# Patient Record
Sex: Male | Born: 1950
Health system: Southern US, Community
[De-identification: ages and names within clinical notes are randomized; demographics above are authoritative.]

## PROBLEM LIST (undated history)

## (undated) DIAGNOSIS — G629 Polyneuropathy, unspecified: Secondary | ICD-10-CM

## (undated) DIAGNOSIS — R9439 Abnormal result of other cardiovascular function study: Secondary | ICD-10-CM

## (undated) DIAGNOSIS — D649 Anemia, unspecified: Secondary | ICD-10-CM

## (undated) DIAGNOSIS — R0602 Shortness of breath: Secondary | ICD-10-CM

## (undated) DIAGNOSIS — D509 Iron deficiency anemia, unspecified: Secondary | ICD-10-CM

## (undated) DIAGNOSIS — I219 Acute myocardial infarction, unspecified: Secondary | ICD-10-CM

## (undated) DIAGNOSIS — E669 Obesity, unspecified: Secondary | ICD-10-CM

## (undated) DIAGNOSIS — K625 Hemorrhage of anus and rectum: Secondary | ICD-10-CM

## (undated) DIAGNOSIS — I2510 Atherosclerotic heart disease of native coronary artery without angina pectoris: Secondary | ICD-10-CM

## (undated) DIAGNOSIS — I10 Essential (primary) hypertension: Secondary | ICD-10-CM

## (undated) DIAGNOSIS — E785 Hyperlipidemia, unspecified: Secondary | ICD-10-CM

## (undated) DIAGNOSIS — I249 Acute ischemic heart disease, unspecified: Secondary | ICD-10-CM

## (undated) HISTORY — DX: Polyneuropathy, unspecified: G62.9

## (undated) HISTORY — PX: OTHER SURGICAL HISTORY: SHX169

---

## 1979-02-01 HISTORY — PX: EXCISIONAL HEMORRHOIDECTOMY: SHX1541

## 1998-02-11 ENCOUNTER — Emergency Department (HOSPITAL_COMMUNITY): Admission: EM | Admit: 1998-02-11 | Discharge: 1998-02-12 | Payer: Self-pay | Admitting: Emergency Medicine

## 2005-06-02 HISTORY — PX: TESTICLE REMOVAL: SHX68

## 2006-01-04 ENCOUNTER — Emergency Department (HOSPITAL_COMMUNITY): Admission: EM | Admit: 2006-01-04 | Discharge: 2006-01-04 | Payer: Self-pay | Admitting: Emergency Medicine

## 2006-01-14 ENCOUNTER — Ambulatory Visit (HOSPITAL_COMMUNITY): Admission: RE | Admit: 2006-01-14 | Discharge: 2006-01-14 | Payer: Self-pay | Admitting: Urology

## 2006-01-21 ENCOUNTER — Encounter (INDEPENDENT_AMBULATORY_CARE_PROVIDER_SITE_OTHER): Payer: Self-pay | Admitting: Specialist

## 2006-01-21 ENCOUNTER — Ambulatory Visit (HOSPITAL_BASED_OUTPATIENT_CLINIC_OR_DEPARTMENT_OTHER): Admission: RE | Admit: 2006-01-21 | Discharge: 2006-01-21 | Payer: Self-pay | Admitting: Urology

## 2009-06-02 DIAGNOSIS — I249 Acute ischemic heart disease, unspecified: Secondary | ICD-10-CM

## 2009-06-02 HISTORY — DX: Acute ischemic heart disease, unspecified: I24.9

## 2009-07-08 ENCOUNTER — Inpatient Hospital Stay (HOSPITAL_COMMUNITY): Admission: RE | Admit: 2009-07-08 | Discharge: 2009-07-12 | Payer: Self-pay | Admitting: Emergency Medicine

## 2009-10-26 ENCOUNTER — Inpatient Hospital Stay (HOSPITAL_COMMUNITY): Admission: AD | Admit: 2009-10-26 | Discharge: 2009-10-27 | Payer: Self-pay | Admitting: Cardiovascular Disease

## 2010-08-19 LAB — URINALYSIS, ROUTINE W REFLEX MICROSCOPIC
Bilirubin Urine: NEGATIVE
Hgb urine dipstick: NEGATIVE
Nitrite: NEGATIVE
Protein, ur: NEGATIVE mg/dL

## 2010-08-19 LAB — CARDIAC PANEL(CRET KIN+CKTOT+MB+TROPI)
Total CK: 102 U/L (ref 7–232)
Total CK: 119 U/L (ref 7–232)
Troponin I: 0.16 ng/mL — ABNORMAL HIGH (ref 0.00–0.06)
Troponin I: 0.23 ng/mL — ABNORMAL HIGH (ref 0.00–0.06)

## 2010-08-19 LAB — LIPID PANEL
HDL: 55 mg/dL (ref >39)
Triglycerides: 120 mg/dL (ref <150)
VLDL: 24 mg/dL (ref 0–40)

## 2010-08-19 LAB — BASIC METABOLIC PANEL
BUN: 10 mg/dL (ref 6–23)
CO2: 29 mEq/L (ref 19–32)
Calcium: 8.8 mg/dL (ref 8.4–10.5)
Calcium: 9.1 mg/dL (ref 8.4–10.5)
GFR calc Af Amer: >60 mL/min (ref >60)
GFR calc non Af Amer: >60 mL/min (ref >60)
Glucose, Bld: 141 mg/dL — ABNORMAL HIGH (ref 70–99)
Glucose, Bld: 96 mg/dL (ref 70–99)
Potassium: 3.8 mEq/L (ref 3.5–5.1)

## 2010-08-19 LAB — CBC
HCT: 37.7 % — ABNORMAL LOW (ref 39.0–52.0)
Hemoglobin: 13.3 g/dL (ref 13.0–17.0)
MCHC: 35.3 g/dL (ref 30.0–36.0)
MCHC: 35.4 g/dL (ref 30.0–36.0)
MCV: 88 fL (ref 78.0–100.0)
Platelets: 245 10*3/uL (ref 150–400)
RBC: 4.1 MIL/uL — ABNORMAL LOW (ref 4.22–5.81)
RBC: 4.26 MIL/uL (ref 4.22–5.81)
RDW: 13.5 % (ref 11.5–15.5)
RDW: 13.6 % (ref 11.5–15.5)

## 2010-08-19 LAB — PROTIME-INR
INR: 0.85 (ref 0.00–1.49)
Prothrombin Time: 11.5 seconds — ABNORMAL LOW (ref 11.6–15.2)

## 2010-08-19 LAB — URINE MICROSCOPIC-ADD ON

## 2010-08-22 LAB — DIFFERENTIAL
Basophils Absolute: 0 10*3/uL (ref 0.0–0.1)
Lymphocytes Relative: 9 % — ABNORMAL LOW (ref 12–46)
Monocytes Absolute: 0.7 10*3/uL (ref 0.1–1.0)
Monocytes Relative: 5 % (ref 3–12)
Neutro Abs: 11.8 10*3/uL — ABNORMAL HIGH (ref 1.7–7.7)

## 2010-08-22 LAB — URINALYSIS, ROUTINE W REFLEX MICROSCOPIC
Bilirubin Urine: NEGATIVE
Ketones, ur: NEGATIVE mg/dL
Protein, ur: NEGATIVE mg/dL
Urobilinogen, UA: 0.2 mg/dL (ref 0.0–1.0)

## 2010-08-22 LAB — COMPREHENSIVE METABOLIC PANEL
ALT: 17 U/L (ref 0–53)
AST: 19 U/L (ref 0–37)
Albumin: 3.2 g/dL — ABNORMAL LOW (ref 3.5–5.2)
CO2: 23 mEq/L (ref 19–32)
CO2: 26 mEq/L (ref 19–32)
Calcium: 8.7 mg/dL (ref 8.4–10.5)
Calcium: 8.8 mg/dL (ref 8.4–10.5)
Chloride: 106 mEq/L (ref 96–112)
Chloride: 107 mEq/L (ref 96–112)
Glucose, Bld: 108 mg/dL — ABNORMAL HIGH (ref 70–99)
Potassium: 4.2 mEq/L (ref 3.5–5.1)
Sodium: 140 mEq/L (ref 135–145)
Total Bilirubin: 0.9 mg/dL (ref 0.3–1.2)
Total Protein: 6.1 g/dL (ref 6.0–8.3)
Total Protein: 6.4 g/dL (ref 6.0–8.3)

## 2010-08-22 LAB — CBC
HCT: 43.4 % (ref 39.0–52.0)
HCT: 47.2 % (ref 39.0–52.0)
Hemoglobin: 13.8 g/dL (ref 13.0–17.0)
Hemoglobin: 14.9 g/dL (ref 13.0–17.0)
MCHC: 34.4 g/dL (ref 30.0–36.0)
MCHC: 34.6 g/dL (ref 30.0–36.0)
MCHC: 34.7 g/dL (ref 30.0–36.0)
MCV: 90.4 fL (ref 78.0–100.0)
MCV: 91.1 fL (ref 78.0–100.0)
MCV: 92.2 fL (ref 78.0–100.0)
Platelets: 262 10*3/uL (ref 150–400)
RBC: 4.38 MIL/uL (ref 4.22–5.81)
RBC: 4.7 MIL/uL (ref 4.22–5.81)
RBC: 5.19 MIL/uL (ref 4.22–5.81)
RDW: 13 % (ref 11.5–15.5)
RDW: 13.1 % (ref 11.5–15.5)
WBC: 11.8 10*3/uL — ABNORMAL HIGH (ref 4.0–10.5)

## 2010-08-22 LAB — PROTIME-INR
INR: 0.88 (ref 0.00–1.49)
Prothrombin Time: 11.9 seconds (ref 11.6–15.2)

## 2010-08-22 LAB — CK TOTAL AND CKMB (NOT AT ARMC)
CK, MB: 3.2 ng/mL (ref 0.3–4.0)
Relative Index: INVALID (ref 0.0–2.5)
Relative Index: INVALID (ref 0.0–2.5)
Total CK: 79 U/L (ref 7–232)
Total CK: 87 U/L (ref 7–232)

## 2010-08-22 LAB — POCT I-STAT, CHEM 8
HCT: 48 % (ref 39.0–52.0)
Potassium: 3.4 mEq/L — ABNORMAL LOW (ref 3.5–5.1)
Sodium: 138 mEq/L (ref 135–145)

## 2010-08-22 LAB — LIPID PANEL
HDL: 31 mg/dL — ABNORMAL LOW (ref >39)
LDL Cholesterol: 146 mg/dL — ABNORMAL HIGH (ref 0–99)
Triglycerides: 150 mg/dL — ABNORMAL HIGH (ref <150)
VLDL: 30 mg/dL (ref 0–40)

## 2010-08-22 LAB — POCT I-STAT 3, ART BLOOD GAS (G3+)
O2 Saturation: 98 %
TCO2: 25 mmol/L (ref 0–100)
pH, Arterial: 7.42 (ref 7.350–7.450)
pO2, Arterial: 107 mmHg — ABNORMAL HIGH (ref 80.0–100.0)

## 2010-08-22 LAB — CARDIAC PANEL(CRET KIN+CKTOT+MB+TROPI)
CK, MB: 1.4 ng/mL (ref 0.3–4.0)
Relative Index: INVALID (ref 0.0–2.5)
Troponin I: 0.25 ng/mL — ABNORMAL HIGH (ref 0.00–0.06)
Troponin I: 0.41 ng/mL — ABNORMAL HIGH (ref 0.00–0.06)

## 2010-08-22 LAB — BASIC METABOLIC PANEL
CO2: 26 mEq/L (ref 19–32)
Calcium: 8.5 mg/dL (ref 8.4–10.5)
Calcium: 8.6 mg/dL (ref 8.4–10.5)
Chloride: 105 mEq/L (ref 96–112)
Creatinine, Ser: 0.98 mg/dL (ref 0.4–1.5)
Creatinine, Ser: 1.01 mg/dL (ref 0.4–1.5)
GFR calc Af Amer: >60 mL/min (ref >60)
Glucose, Bld: 116 mg/dL — ABNORMAL HIGH (ref 70–99)
Sodium: 137 mEq/L (ref 135–145)

## 2010-08-22 LAB — URINE MICROSCOPIC-ADD ON

## 2010-08-22 LAB — APTT: aPTT: 38 seconds — ABNORMAL HIGH (ref 24–37)

## 2010-10-18 NOTE — Op Note (Signed)
NAME:  Kyle Arroyo, Kyle Arroyo NO.:  [id number]   MEDICAL RECORD NO.:  [id number]          PATIENT TYPE:  AMB   LOCATION:  [facility]                         FACILITY:  [facility]   PHYSICIAN:  Valetta Fuller, M.D.  DATE OF BIRTH:  1950/07/15   DATE OF PROCEDURE:  DATE OF DISCHARGE:                                 OPERATIVE REPORT   PREOPERATIVE DIAGNOSIS:  Right testicular mass/abnormality.   POSTOPERATIVE DIAGNOSIS:  Right testicular mass/abnormality.   PROCEDURE PERFORMED:  Right inguinal exploration with right radical  orchiectomy.   SURGEON:  Dr. Isabel Caprice   ANESTHESIA:  General   INDICATIONS:  Mr. Hymas has had a somewhat complicated history over the  last several weeks to month.  Patient is a 60 year old male with no real  urologic history other than a remote history of vasectomy.  He initially  developed some upper respiratory issues with productive cough.  He was seen  on several occasions at a walk-in clinic and was treated with a course of  antibiotics.  His pulmonary situation improved but then he began developing  left-sided testicular pain.  He had no voiding complaints, no other systemic  complaints.  The patient presented to the emergency room at [facility]  There the patient had unremarkable urine.  Laboratory studies  were relatively unremarkable.  The patient had ultrasound of the scrotum  because of the left side testicular pain and this showed marked  abnormalities actually in the right testis.  There were at least two  separate focal lesions that showed both cystic and solid components and were  markedly abnormal.  Blood flow was relatively normal.  At that time we asked  the emergency room to send him to our office after drawing some routine  blood work.  When we saw him several days later the patient was still having  some left-sided testicular pain, although again the ultrasound abnormalities  were on the right side.  We did obtain LDH,  alpha-fetoprotein, and beta hCG  levels which were all normal.  We felt that the normal markers did not  exclude the possibility of testicular neoplasm and certainly the ultrasound  was very worrisome for the possibility of testicular neoplasm.  This was  both my interpretation as well as the radiologist at [facility].  At that  time the patient felt convinced that this was somehow related to the  Levaquin he had taken and was potentially an inflammatory process.  I  decided to go ahead and get abdominal and pelvic CT.  This did show at least  some retroperitoneal adenopathy although nothing bulky.  He did have lymph  nodes that were enlarged based on CT criteria with the largest measuring  about 1.5 cm.  We acquiesced to the patient's wishes to continue to watch  things and a repeat ultrasound was done approximately 7-10 days later.  This  showed continued presence of at least two separate very discrete nodules  within the testicular parenchyma on the right.  The radiologist's  interpretation was again that this was highly worrisome for malignancy.  At  this point I told Mr. and Mrs. Hundal that he does have marked abnormalities  in the right testis and certainly there is a high degree of suspicion that  this could represent testicular neoplasm.  Markers were unremarkable but he  did have at least some retroperitoneal adenopathy and we felt that otherwise  this was extremely unlikely to be an infectious process since he was  otherwise afebrile, having no other systemic complaints and really a  nontender testicle.  We felt that given the marked abnormalities it was  certainly prudent to go ahead and remove that testicle.  Obviously there are  no fertility concerns since the patient has already had vasectomy.  He is  told that the testis does provide testosterone but the contralateral testis  ought to provide enough hormone and if not, supplementation is a relatively  straightforward  process.  I cannot guarantee him that this is a testicular  neoplasm but I told him that certainly he was better to remove the testicle  that does not have cancer than to leave one behind with cancer and he  appeared to understand this.  We talked about a potentially getting a second  opinion.  The patient did eventually agree to go ahead and have orchiectomy.  He appeared to understand the advantages and disadvantages of this.  We felt  that certainly given the marked abnormalities on ultrasound that this was  the prudent thing to do.   DESCRIPTION OF PROCEDURE:  The patient was brought to the operating room  where he had successful induction of general anesthesia.  Placed in supine  position, prepped, draped in usual manner.  A standard right inguinal  incision was performed.  This was taken down to level of the external  oblique fascia where the external ring was identified.  The external oblique  fascia was then incised in the direction of its fibers taking care not to  injure any underlying ilioinguinal nerve.  Nerve was seen and preserved.  Once the spermatic cord was identified.  We isolated this at the level of  the pubic tubercle.  A Penrose drain was then placed around spermatic cord  and using very gentle traction we were able to free the cord from underlying  cremasteric fibers.  The Penrose drain was then looped twice around the  spermatic cord proximally and clamped to allow for safer manipulation of the  testis.  With eversion of the scrotum.  The testis was freed from genicular  as well as some additional soft tissue fibers and then brought out of the  incision.  Palpably one could feel at least two discrete very hard areas in  the testicle that were again very concerning for malignancy.  I did not feel  biopsy we changed my management given the marked abnormalities felt safest  thing to do was to indeed remove that testicle.  With the spermatic cord clamp we were able to  dissect this proximally to the internal ring.  The  entire spermatic cord was then transected with double hemostats and suture  ligatures x2 with 0 silk suture.  These were purposely left long for  identification if retroperitoneal lymph node dissection is required.  The  inguinal wound was then copiously irrigated.   Attention was then turned to closure.  The external oblique fascia was  closed with a running 2-0 Vicryl suture, again making sure that we did not  injure the underlying inguinal nerve.  Subcutaneous tissues and Scarpa's  fascia was  closed with a running 3-0 Vicryl.  The skin was closed in  subcuticular manner.  Some lidocaine/Marcaine was instilled for postop  analgesia.  The patient appeared to tolerate the procedure well with no  obvious complications.  Sponge and needle counts were correct.  He is  brought to recovery room in stable condition.           ______________________________  Valetta Fuller, M.D.  Electronically Signed     DSG/MEDQ  D:  01/21/2006  T:  01/22/2006  Job:  [id number]   cc:   Tally Joe, M.D.  Fax: [phone]

## 2011-06-03 HISTORY — PX: CORONARY ANGIOPLASTY: SHX604

## 2011-06-16 ENCOUNTER — Ambulatory Visit
Admission: RE | Admit: 2011-06-16 | Discharge: 2011-06-16 | Disposition: A | Discharge status: Home / Self Care | Payer: BC Managed Care – PPO | Source: Ambulatory Visit | Attending: Cardiovascular Disease | Admitting: Cardiovascular Disease

## 2011-06-16 ENCOUNTER — Other Ambulatory Visit: Payer: Self-pay | Admitting: Cardiovascular Disease

## 2011-06-16 DIAGNOSIS — Z01818 Encounter for other preprocedural examination: Secondary | ICD-10-CM

## 2011-06-16 DIAGNOSIS — R0602 Shortness of breath: Secondary | ICD-10-CM

## 2011-06-16 DIAGNOSIS — I2510 Atherosclerotic heart disease of native coronary artery without angina pectoris: Secondary | ICD-10-CM

## 2011-06-17 ENCOUNTER — Other Ambulatory Visit: Payer: Self-pay | Admitting: Cardiovascular Disease

## 2011-06-19 ENCOUNTER — Encounter (HOSPITAL_COMMUNITY)
Admission: RE | Disposition: A | Discharge status: Home / Self Care | Payer: Self-pay | Source: Ambulatory Visit | Attending: Cardiovascular Disease

## 2011-06-19 ENCOUNTER — Other Ambulatory Visit: Payer: Self-pay

## 2011-06-19 ENCOUNTER — Encounter (HOSPITAL_COMMUNITY): Payer: Self-pay | Admitting: Physician Assistant

## 2011-06-19 ENCOUNTER — Ambulatory Visit (HOSPITAL_COMMUNITY)
Admission: RE | Admit: 2011-06-19 | Discharge: 2011-06-20 | Disposition: A | Discharge status: Home / Self Care | Payer: BC Managed Care – PPO | Source: Ambulatory Visit | Attending: Cardiovascular Disease | Admitting: Cardiovascular Disease

## 2011-06-19 DIAGNOSIS — D649 Anemia, unspecified: Secondary | ICD-10-CM | POA: Diagnosis present

## 2011-06-19 DIAGNOSIS — Y831 Surgical operation with implant of artificial internal device as the cause of abnormal reaction of the patient, or of later complication, without mention of misadventure at the time of the procedure: Secondary | ICD-10-CM | POA: Insufficient documentation

## 2011-06-19 DIAGNOSIS — Z9861 Coronary angioplasty status: Secondary | ICD-10-CM | POA: Insufficient documentation

## 2011-06-19 DIAGNOSIS — T82897A Other specified complication of cardiac prosthetic devices, implants and grafts, initial encounter: Secondary | ICD-10-CM | POA: Insufficient documentation

## 2011-06-19 DIAGNOSIS — K625 Hemorrhage of anus and rectum: Secondary | ICD-10-CM

## 2011-06-19 DIAGNOSIS — D509 Iron deficiency anemia, unspecified: Secondary | ICD-10-CM | POA: Insufficient documentation

## 2011-06-19 DIAGNOSIS — E119 Type 2 diabetes mellitus without complications: Secondary | ICD-10-CM | POA: Diagnosis present

## 2011-06-19 DIAGNOSIS — I2510 Atherosclerotic heart disease of native coronary artery without angina pectoris: Secondary | ICD-10-CM | POA: Insufficient documentation

## 2011-06-19 DIAGNOSIS — R9439 Abnormal result of other cardiovascular function study: Secondary | ICD-10-CM | POA: Insufficient documentation

## 2011-06-19 DIAGNOSIS — E669 Obesity, unspecified: Secondary | ICD-10-CM | POA: Diagnosis present

## 2011-06-19 HISTORY — DX: Iron deficiency anemia, unspecified: D50.9

## 2011-06-19 HISTORY — PX: LEFT HEART CATHETERIZATION WITH CORONARY ANGIOGRAM: SHX5451

## 2011-06-19 HISTORY — DX: Acute ischemic heart disease, unspecified: I24.9

## 2011-06-19 HISTORY — DX: Atherosclerotic heart disease of native coronary artery without angina pectoris: I25.10

## 2011-06-19 HISTORY — DX: Hyperlipidemia, unspecified: E78.5

## 2011-06-19 HISTORY — DX: Obesity, unspecified: E66.9

## 2011-06-19 HISTORY — DX: Anemia, unspecified: D64.9

## 2011-06-19 HISTORY — DX: Acute myocardial infarction, unspecified: I21.9

## 2011-06-19 HISTORY — DX: Abnormal result of other cardiovascular function study: R94.39

## 2011-06-19 HISTORY — DX: Shortness of breath: R06.02

## 2011-06-19 HISTORY — DX: Essential (primary) hypertension: I10

## 2011-06-19 HISTORY — DX: Hemorrhage of anus and rectum: K62.5

## 2011-06-19 LAB — CBC
HCT: 30.1 % — ABNORMAL LOW (ref 39.0–52.0)
MCHC: 28.2 g/dL — ABNORMAL LOW (ref 30.0–36.0)
RDW: 17.3 % — ABNORMAL HIGH (ref 11.5–15.5)

## 2011-06-19 LAB — FERRITIN: Ferritin: 10 ng/mL — ABNORMAL LOW (ref 22–322)

## 2011-06-19 LAB — RETICULOCYTES: Retic Count, Absolute: 92 10*3/uL (ref 19.0–186.0)

## 2011-06-19 LAB — IRON AND TIBC
Iron: 13 ug/dL — ABNORMAL LOW (ref 42–135)
UIBC: 538 ug/dL — ABNORMAL HIGH (ref 125–400)

## 2011-06-19 SURGERY — LEFT HEART CATHETERIZATION WITH CORONARY ANGIOGRAM
Anesthesia: LOCAL

## 2011-06-19 MED ORDER — ONDANSETRON HCL 4 MG/2ML IJ SOLN: 4.0000 mg | Freq: Four times a day (QID) | INTRAMUSCULAR | Status: DC | PRN

## 2011-06-19 MED ORDER — ROSUVASTATIN CALCIUM 20 MG PO TABS
20.0000 mg | ORAL_TABLET | Freq: Every day | ORAL | Status: DC
  Administered 2011-06-19: 20 mg via ORAL
  Filled 2011-06-19 (×2): qty 1

## 2011-06-19 MED ORDER — ASPIRIN 81 MG PO CHEW
81.0000 mg | CHEWABLE_TABLET | Freq: Every day | ORAL | Status: DC
  Administered 2011-06-20: 81 mg via ORAL
  Filled 2011-06-19: qty 1

## 2011-06-19 MED ORDER — PRASUGREL HCL 10 MG PO TABS
10.0000 mg | ORAL_TABLET | Freq: Every day | ORAL | Status: DC
  Filled 2011-06-19 (×2): qty 1

## 2011-06-19 MED ORDER — DIAZEPAM 5 MG PO TABS
5.0000 mg | ORAL_TABLET | ORAL | Status: AC
  Administered 2011-06-19: 5 mg via ORAL
  Filled 2011-06-19: qty 1

## 2011-06-19 MED ORDER — ASPIRIN EC 325 MG PO TBEC: 325.0000 mg | DELAYED_RELEASE_TABLET | Freq: Every day | ORAL | Status: DC

## 2011-06-19 MED ORDER — MIDAZOLAM HCL 2 MG/2ML IJ SOLN
INTRAMUSCULAR | Status: AC
  Filled 2011-06-19: qty 2

## 2011-06-19 MED ORDER — ACETAMINOPHEN 325 MG PO TABS: 650.0000 mg | ORAL_TABLET | ORAL | Status: DC | PRN

## 2011-06-19 MED ORDER — ALPRAZOLAM 0.25 MG PO TABS: 0.2500 mg | ORAL_TABLET | Freq: Three times a day (TID) | ORAL | Status: DC | PRN

## 2011-06-19 MED ORDER — LIDOCAINE HCL (PF) 1 % IJ SOLN
INTRAMUSCULAR | Status: AC
  Filled 2011-06-19: qty 30

## 2011-06-19 MED ORDER — LISINOPRIL-HYDROCHLOROTHIAZIDE 10-12.5 MG PO TABS
1.0000 | ORAL_TABLET | Freq: Two times a day (BID) | ORAL | Status: DC
Start: 2011-06-19 — End: 2011-06-19

## 2011-06-19 MED ORDER — SODIUM CHLORIDE 0.9 % IJ SOLN: 3.0000 mL | INTRAMUSCULAR | Status: DC | PRN

## 2011-06-19 MED ORDER — NAPROXEN SODIUM 220 MG PO TABS: 220.0000 mg | ORAL_TABLET | Freq: Two times a day (BID) | ORAL | Status: DC | PRN

## 2011-06-19 MED ORDER — SODIUM CHLORIDE 0.9 % IV SOLN
INTRAVENOUS | Status: DC
  Administered 2011-06-19: 09:00:00 via INTRAVENOUS

## 2011-06-19 MED ORDER — NAPROXEN 250 MG PO TABS
250.0000 mg | ORAL_TABLET | Freq: Two times a day (BID) | ORAL | Status: DC | PRN
  Filled 2011-06-19: qty 1

## 2011-06-19 MED ORDER — FUROSEMIDE 20 MG PO TABS
20.0000 mg | ORAL_TABLET | Freq: Every day | ORAL | Status: DC | PRN
  Filled 2011-06-19: qty 1

## 2011-06-19 MED ORDER — VERAPAMIL HCL 2.5 MG/ML IV SOLN
INTRAVENOUS | Status: AC
  Filled 2011-06-19: qty 2

## 2011-06-19 MED ORDER — NITROGLYCERIN 0.2 MG/ML ON CALL CATH LAB
INTRAVENOUS | Status: AC
  Filled 2011-06-19: qty 1

## 2011-06-19 MED ORDER — BIVALIRUDIN 250 MG IV SOLR
INTRAVENOUS | Status: AC
  Filled 2011-06-19: qty 250

## 2011-06-19 MED ORDER — HYDROCHLOROTHIAZIDE 12.5 MG PO CAPS
12.5000 mg | ORAL_CAPSULE | Freq: Two times a day (BID) | ORAL | Status: DC
  Administered 2011-06-19 – 2011-06-20 (×2): 12.5 mg via ORAL
  Filled 2011-06-19 (×4): qty 1

## 2011-06-19 MED ORDER — NEBIVOLOL HCL 10 MG PO TABS
10.0000 mg | ORAL_TABLET | Freq: Every day | ORAL | Status: DC
  Administered 2011-06-20: 10 mg via ORAL
  Filled 2011-06-19 (×2): qty 1

## 2011-06-19 MED ORDER — SODIUM CHLORIDE 0.9 % IV SOLN
INTRAVENOUS | Status: AC
  Administered 2011-06-19: 12:00:00 via INTRAVENOUS

## 2011-06-19 MED ORDER — PRASUGREL HCL 10 MG PO TABS
10.0000 mg | ORAL_TABLET | Freq: Every day | ORAL | Status: DC
  Filled 2011-06-19: qty 1

## 2011-06-19 MED ORDER — ADENOSINE 12 MG/4ML IV SOLN
16.0000 mL | INTRAVENOUS | Status: DC
  Filled 2011-06-19: qty 16

## 2011-06-19 MED ORDER — TRAMADOL HCL 50 MG PO TABS
50.0000 mg | ORAL_TABLET | Freq: Four times a day (QID) | ORAL | Status: DC | PRN
  Filled 2011-06-19: qty 1

## 2011-06-19 MED ORDER — HEPARIN SODIUM (PORCINE) 1000 UNIT/ML IJ SOLN
INTRAMUSCULAR | Status: AC
  Filled 2011-06-19: qty 1

## 2011-06-19 MED ORDER — LISINOPRIL 10 MG PO TABS
10.0000 mg | ORAL_TABLET | Freq: Two times a day (BID) | ORAL | Status: DC
  Administered 2011-06-19 – 2011-06-20 (×2): 10 mg via ORAL
  Filled 2011-06-19 (×4): qty 1

## 2011-06-19 MED ORDER — FENTANYL CITRATE 0.05 MG/ML IJ SOLN
INTRAMUSCULAR | Status: AC
  Filled 2011-06-19: qty 2

## 2011-06-19 MED ORDER — HEPARIN (PORCINE) IN NACL 2-0.9 UNIT/ML-% IJ SOLN
INTRAMUSCULAR | Status: AC
  Filled 2011-06-19: qty 2000

## 2011-06-19 MED ORDER — TESTOSTERONE 50 MG/5GM (1%) TD GEL: 5.0000 g | Freq: Every day | TRANSDERMAL | Status: DC

## 2011-06-19 MED ORDER — ZOLPIDEM TARTRATE 5 MG PO TABS: 10.0000 mg | ORAL_TABLET | Freq: Every evening | ORAL | Status: DC | PRN

## 2011-06-19 NOTE — H&P (Signed)
    Pt was reexamined and existing H & P reviewed. No changes found.  Runell Gess, MD [facility] 06/19/2011 9:30 AM

## 2011-06-19 NOTE — Op Note (Signed)
Kyle Arroyo is a 61 y.o. male    [id number] LOCATION:  FACILITY: [facility]  PHYSICIAN: Nanetta Batty, M.D. [birth year]   DATE OF PROCEDURE:  06/19/2011  DATE OF DISCHARGE:  [facility]  CARDIAC CATHETERIZATION     History obtained from chart review. He is a 61 year old moderately overweight married Caucasian male, father of 3, grandfather to one grandchild, has a history of CAD status post stenting of his proximal circumflex and distal right coronary artery in the setting of acute coronary syndrome in February of 2011. His other problems include discontinue tobacco abuse at that time, hypertension and hyperlipidemia. He was recatheterized by Dr. Nicholaus Bloom May of 2000 Remus Loffler in the setting of acute coronary syndrome with documented patent circumflex stent and high-grade in-stent restenosis and put within the distal right bony artery stent which Dr. Tresa Endo reinterpreting dawn. He has been complaining of increasing dyspnea on exertion or lower extremity edema for the prior 2 weeks and was aware of salt restriction. I obtained a 2-D echo which showed essentially normal bili function and a Myoview stress test that showed normal perfusion images. However, on exercise he did have 2-3 mm of ST segment depression. In addition, his preprocedure labs were remarkable for hemoglobin of 8.1 which was a new finding. Because of the symptoms of his abnormal electrocardiographic response to exercise he presents today for outpatient diagnostic coronary arteriography via the right radial approach to define his anatomy and rule out an ischemic etiology.   PROCEDURE DESCRIPTION:    The patient was brought to the second floor  [place] [facility] cath lab in the postabsorptive state. He was  premedicated with 5 mg of Valium by mouth as well as intravenous Versed and fentanyl.Marland Kitchen His right wrist was prepped and shaved in usual sterile fashion. Xylocaine 1% was used  for local anesthesia. A 5 French  sheath was inserted into the right radial  artery using standard Seldinger technique. The patient received  5000 units  of heparin  intravenously.  Using a 5 [place] TIG 4 catheter along with an along with a 5 French pigtail catheter selective coronary angiography, left ventriculography were performed. Visipaque dye was used for the entirety of the case. Retrograde aortic,left ventricular pressures were recorded.    HEMODYNAMICS:    AO SYSTOLIC/AO DIASTOLIC: 94/58   LV SYSTOLIC/LV DIASTOLIC: 99/11  ANGIOGRAPHIC RESULTS:   1. Left main; normal  2. LAD; normal 3. Left circumflex; codominant with 50% in-stent restenosis within the proximal circumflex bare-metal stent. This ended just before a moderate size first marginal branch which had diffuse segmental proximal 50% stenosis. The distal circumflex just prior to a post posterior descending branch had a 50% stenosis.Marland Kitchen  4. Right coronary artery; patent stent within the distal right coronary artery with at most percent in-stent restenosis. 5. Left ventriculography; RAO left ventriculogram was performed using  25 mL of Visipaque dye at 12 mL/second. The overall LVEF estimated  60 % Without wall motion abnormalities  IMPRESSION:Rohit has moderate in-stent restenosis within the proximal codominant circumflex bare-metal stent. We will proceed with FFR guided intervention.  Procedure: The existing 5 French sheath in the right radial artery was exchanged over a wire for a 6 [place] sheath. The patient received Intimax bolus and ACT 397. A total of 70 cc of contrast was administered to the patient. Using a 6 [place] XB 3.5 guide catheter along with an FFR wire fractional flow reserve was performed. The FFR measured 0.83 suggesting that the proximal circumflex lesion  was not physiologically significant.  Final impression: Johneric has moderate CAD with 50% proximal circumflex in-stent restenosis. It does not appear to be physiologically significant by FFR at  his LV function is normal. Based on this I decided not to proceed with intervention but rather to further evaluate the etiology of his unexplained anemia which may be responsible for his symptoms. He is on aspirin and effient his stents.the right radial sheath was removed and  A TR band was placed on the right wrist chief patent hemostasis The .patient left the lab in stable condition. A GI consult will be obtained.  Runell Gess MD, [facility] 06/19/2011 10:48 AM

## 2011-06-19 NOTE — Consult Note (Signed)
[facility] Consult: 1:38 PM 06/19/2011   Referring Provider: Nanetta Batty Primary Care Physician:  Holley Bouche Primary Gastroenterologist:  None  Reason for Consultation:  Microcytic anemia and rectal bleeding  HPI: Kyle Arroyo is a 61 y.o. male.  He is an obese type 2 diabetic.  Hx acute coronary syndrome in 2011, staged bare metal stents placed in February, angioplasty for in-stent restenosis in May of that year.  Takes full strength ASA and Effient.   Since at least Thanksgiving 2012 has had progressive DOE with climbing stairs etc., fatigue, cladication pattern pain in left thigh and calf.  Cardiac cath done today and shows moderate in stent restenosis and native dz not severe enough to explain sxs.  Dr berry notes drop in Hgb. It was 13.7 with normal MCV in 11/20011, 10.5 with mcv 72.0 04/14/2011.  Today Hgb down to 8.5.  Pt had hemorrhoidectomy 30 years ago.  For at least 2 years he has had painless bleeding per rectum.  Passes blood after he passes normal brown colored stools.  Blood turns commode water red.  He does this on average 5 times weekly.  Has the bleeding more often than he doesn't.  Iron studies are pending.  Does not use any hemorrhoid meds.  Eats very little red meat.  No upper GI symptoms (nausea, anorexia, dysphagia, reflux, heartburn).  Never had an EGD or colonoscopy.  Past Medical History  Diagnosis Date  . Hyperlipidemia   . Acute coronary syndrome 2011  . Diabetes mellitus     type 2  . Obesity (BMI 30-39.9)   . Microcytic anemia 2012/13  . Bright red rectal bleeding     Past Surgical History  Procedure Date  . Testicle removal 2007    benign testicle mass, right  . Excisional hemorrhoidectomy 1980's  . Srabismus surgery     childhood    Prior to Admission medications   Medication Sig Start Date End Date Taking? Authorizing Provider  aspirin EC 325 MG tablet Take 325 mg by mouth daily.   Yes Historical  Provider, MD  furosemide (LASIX) 20 MG tablet Take 20 mg by mouth daily as needed. For swelling   Yes Historical Provider, MD  lisinopril-hydrochlorothiazide (PRINZIDE,ZESTORETIC) 10-12.5 MG per tablet Take 1 tablet by mouth 2 (two) times daily.   Yes Historical Provider, MD  naproxen sodium (ANAPROX) 220 MG tablet Take 220 mg by mouth 2 (two) times daily as needed. For pain   Yes Historical Provider, MD  nebivolol (BYSTOLIC) 10 MG tablet Take 10 mg by mouth daily.   Yes Historical Provider, MD  OVER THE COUNTER MEDICATION Take 1 tablet by mouth daily. Co-q-10   Yes Historical Provider, MD  prasugrel (EFFIENT) 10 MG TABS Take 10 mg by mouth daily.   Yes Historical Provider, MD  rosuvastatin (CRESTOR) 20 MG tablet Take 20 mg by mouth at bedtime.   Yes Historical Provider, MD  testosterone (ANDROGEL) 50 MG/5GM GEL Place 5 g onto the skin daily.   Yes Historical Provider, MD  zolpidem (AMBIEN) 10 MG tablet Take 10 mg by mouth at bedtime as needed. For sleep   Yes Historical Provider, MD    Scheduled Meds:    . aspirin  81 mg Oral Daily  . aspirin EC  325 mg Oral Daily  . bivalirudin      . diazepam  5 mg Oral On Call  . fentaNYL      . heparin      . heparin      .  lidocaine      . lisinopril-hydrochlorothiazide  1 tablet Oral BID  . midazolam      . nebivolol  10 mg Oral Daily  . nitroGLYCERIN      . prasugrel  10 mg Oral Daily  . prasugrel  10 mg Oral Daily  . rosuvastatin  20 mg Oral QHS  . testosterone  5 g Transdermal Daily  . verapamil      . verapamil       Infusions:   . sodium chloride 75 mL/hr at 06/19/11 1130   PRN Meds: acetaminophen, furosemide, naproxen sodium:  Uses at most once a month, ondansetron (ZOFRAN) IV, zolpidem, DISCONTD: sodium chloride   Allergies as of 06/13/2011  . (Not on File)    Family history:  No anemia, no cancers, no ulcers, no GI bleeding.  History   Social History  . Marital Status: Single    Spouse Name: N/A    Number of  Children: N/A  . Years of Education: N/A   Occupational History  . Not on file.   Social History Main Topics  . Smoking status: Quit 2011  . Smokeless tobacco: Not on file  . Alcohol Use: Rarely in small amounts  . Drug Use: No  . Sexually Active: Not on file   REVIEW OF SYSTEMS: Constitutional:  Fatigue and weakness ENT:  No nose bleeds.  No dental problems Pulm:  DOE.  No cough.  No hx asthma. CV:  No chest pain, no palpitations GU:  No hematuria, dysuria GI:  As above Heme:  Was never anemic before fall 2012. Transfusions:  None before now Neuro:  No headaches, no dizzyness Derm:  No rash, no sores, no itching Endocrine:  Has never used insulin in outpatient setting.  Hgb A1c was~6.0 in early November 2012 Immunization:  03/2011 flu shot Travel:  No non-domestic travel.   PHYSICAL EXAM: Vital signs in last 24 hours: Temp:  [97.6 F (36.4 C)-97.9 F (36.6 C)] 97.9 F (36.6 C) (01/17 1130) Pulse Rate:  [53-59] 53  (01/17 1130) Resp:  [11-20] 11  (01/17 1130) BP: (136-150)/(70-81) 136/70 mmHg (01/17 1130) SpO2:  [97 %-100 %] 100 % (01/17 1130) Weight:  [109.77 kg (242 lb)] 109.77 kg (242 lb) (01/17 0707)  Last BM Date: 06/19/11  General: Obese, pale white male.  Does not look acutely ill. Head:  No signs of trauma  Eyes:  Conjunctiva is pink Ears:  No hearing aids in place  Nose:  No discharge or blood Mouth:  Moist, pink MM, dentition good Neck:  No JVD or masses. Lungs:  Clear B, no dyspnea or cough. Heart: RRR,  Abdomen:  Obese, ND, NT, no masses/bruits?orgnomegaly.   Rectal: no stool to test, no blood, non-hyperemic visible small hemorrhoids.  No masses.  Musc/Skeltl: no swollen or deformed joints Extremities:  No edema.  Neurologic:  Pleasant, not confused.  Good historian Skin:  No rash or sores Tattoos:  none Nodes:  None at groin or neck   Psych:  Pleasant.  Not anxious or depressed.  LAB RESULTS:  Basename 06/19/11 1234  WBC 7.3  HGB 8.5*    HCT 30.1*  PLT PENDING   BMET Lab Results  Component Value Date   NA 139 10/27/2009   NA 138 10/26/2009   NA 140 07/12/2009   K 3.3* 10/27/2009   K 3.8 10/26/2009   K 4.1 07/12/2009   CL 105 10/27/2009   CL 105 10/26/2009   CL 111 07/12/2009  CO2 27 10/27/2009   CO2 29 10/26/2009   CO2 26 07/12/2009   GLUCOSE 141* 10/27/2009   GLUCOSE 96 10/26/2009   GLUCOSE 116* 07/12/2009   BUN 10 10/27/2009   BUN 15 10/26/2009   BUN 15 07/12/2009   CREATININE 0.99 10/27/2009   CREATININE 1.03 10/26/2009   CREATININE 1.01 07/12/2009   CALCIUM 8.8 10/27/2009   CALCIUM 9.1 10/26/2009   CALCIUM 8.6 07/12/2009   LFT Lab Results  Component Value Date   PROT 6.1 07/09/2009   PROT 6.4 07/08/2009   ALBUMIN 3.2* 07/09/2009   ALBUMIN 3.4* 07/08/2009   AST 19 07/09/2009   AST 18 07/08/2009   ALT 16 07/09/2009   ALT 17 07/08/2009   ALKPHOS 83 07/09/2009   ALKPHOS 93 07/08/2009   BILITOT 0.9 07/09/2009   BILITOT 0.7 07/08/2009   PT/INR Lab Results  Component Value Date   INR 0.85 10/26/2009   INR 0.88 07/11/2009   INR 1.07 07/08/2009    RADIOLOGY STUDIES: None  ENDOSCOPIC STUDIES: none EGD or Colon ever  IMPRESSION: 1.  Rectal bleeding, chronic.  Pt had hemorrhoidectomy many years ago.  Wonder if this is recurrent bleeding from internal hemorrhoids or from neoplasia?   Needs a colonoscopy.  Could be done as out patient.  He will need to come off Effient for this, ideally holding med for 5 days which would need to be approved by cardiologist. 2.  Microcytic anemia, from the rectal bleeding.  Symptomatic with DOE and fatigue.   Pt eats very little red meat, so depleated iron levels and inadequate intake are likely contributing to anemia.    3.  CAD, BMS stent Feb 2011, angioplasty May 2011 for instent restenosis and progression of CAD.  On ASA and Effient.  Cath today with moderate in-stent restenosis, no intervention performed.   His dyspnea may well be a consequence of his anemia.  4.  Claudication  PLAN: 1.  Needs  colonoscopy ideally off Effient, timing to be determined.  Will need follow up CBC with Dr. Tiburcio Pea.   2.  Stop Naproxen, he uses this rarely at home and it is an optional med that worsens GI bleeding. tendency. 3.  Anemia labs ordered by cardiologist.   LOS: 0 days   Jennye Moccasin  06/19/2011, 1:38 PM Pager: [phone]  06/19/2011:1430   Addendum:  Looks like Dr Allyson Sabal has d/cd the Effient and changed to 81 mg ASA.    Attending:  I have seen ane examined the patient. No changes noted or needed to history or PE.  He has a microcytic anemia and chronic rectal bleeding. Could have hemorrhoids (recurrent) or polyp/tumor causing this. Colonoscopy will be appropriate (outpatient) when he is off Effient necessary time - I believe that is 7 days. He will need iron - most likely po will be ok. Will follow-up in AM The risks and benefits as well as alternatives of endoscopic procedure(s) have been discussed and reviewed. All questions answered. The patient agrees to proceed.    Iva Boop, MD, [facility] [phone] (pager) 06/19/2011 6:16 PM

## 2011-06-20 ENCOUNTER — Encounter: Payer: Self-pay | Admitting: Internal Medicine

## 2011-06-20 ENCOUNTER — Encounter (HOSPITAL_COMMUNITY): Payer: Self-pay | Admitting: Cardiology

## 2011-06-20 DIAGNOSIS — R9439 Abnormal result of other cardiovascular function study: Secondary | ICD-10-CM | POA: Diagnosis present

## 2011-06-20 DIAGNOSIS — D649 Anemia, unspecified: Secondary | ICD-10-CM

## 2011-06-20 DIAGNOSIS — I2510 Atherosclerotic heart disease of native coronary artery without angina pectoris: Secondary | ICD-10-CM | POA: Diagnosis present

## 2011-06-20 HISTORY — DX: Atherosclerotic heart disease of native coronary artery without angina pectoris: I25.10

## 2011-06-20 HISTORY — DX: Abnormal result of other cardiovascular function study: R94.39

## 2011-06-20 HISTORY — DX: Anemia, unspecified: D64.9

## 2011-06-20 LAB — TYPE AND SCREEN
ABO/RH(D): A POS
Antibody Screen: NEGATIVE
Unit division: 0
Unit division: 0

## 2011-06-20 LAB — BASIC METABOLIC PANEL
CO2: 29 mEq/L (ref 19–32)
Calcium: 9.3 mg/dL (ref 8.4–10.5)
Chloride: 101 mEq/L (ref 96–112)
Creatinine, Ser: 1.01 mg/dL (ref 0.50–1.35)
Glucose, Bld: 146 mg/dL — ABNORMAL HIGH (ref 70–99)

## 2011-06-20 LAB — CBC
Hemoglobin: 10.2 g/dL — ABNORMAL LOW (ref 13.0–17.0)
MCH: 22.3 pg — ABNORMAL LOW (ref 26.0–34.0)
MCHC: 30.3 g/dL (ref 30.0–36.0)
Platelets: 332 10*3/uL (ref 150–400)
RDW: 18 % — ABNORMAL HIGH (ref 11.5–15.5)

## 2011-06-20 LAB — PREPARE RBC (CROSSMATCH)

## 2011-06-20 LAB — ABO/RH: ABO/RH(D): A POS

## 2011-06-20 MED ORDER — ACETAMINOPHEN 325 MG PO TABS: 650.0000 mg | ORAL_TABLET | ORAL | Status: AC | PRN

## 2011-06-20 MED ORDER — FUROSEMIDE 10 MG/ML IJ SOLN
20.0000 mg | Freq: Once | INTRAMUSCULAR | Status: DC
  Filled 2011-06-20: qty 2

## 2011-06-20 MED ORDER — NITROGLYCERIN 0.4 MG SL SUBL: 0.4000 mg | SUBLINGUAL_TABLET | SUBLINGUAL | Status: DC | PRN

## 2011-06-20 MED ORDER — FERROUS SULFATE 325 (65 FE) MG PO TABS: 325.0000 mg | ORAL_TABLET | Freq: Three times a day (TID) | ORAL | Status: DC

## 2011-06-20 MED ORDER — HYDROCORTISONE ACETATE 25 MG RE SUPP
25.0000 mg | Freq: Two times a day (BID) | RECTAL | Status: DC
Start: 2011-06-20 — End: 2011-06-20
  Filled 2011-06-20 (×2): qty 1

## 2011-06-20 MED ORDER — NITROGLYCERIN 0.4 MG SL SUBL: 0.4000 mg | SUBLINGUAL_TABLET | SUBLINGUAL | Status: AC | PRN

## 2011-06-20 MED ORDER — FERROUS SULFATE 325 (65 FE) MG PO TABS
325.0000 mg | ORAL_TABLET | Freq: Every day | ORAL | Status: DC
  Filled 2011-06-20: qty 1

## 2011-06-20 MED ORDER — FERROUS SULFATE 325 (65 FE) MG PO TABS
325.0000 mg | ORAL_TABLET | Freq: Three times a day (TID) | ORAL | Status: DC
  Filled 2011-06-20 (×3): qty 1

## 2011-06-20 MED ORDER — HYDROCORTISONE ACETATE 25 MG RE SUPP: 25.0000 mg | Freq: Two times a day (BID) | RECTAL | Status: AC

## 2011-06-20 MED ORDER — FUROSEMIDE 10 MG/ML IJ SOLN
40.0000 mg | Freq: Once | INTRAMUSCULAR | Status: DC
  Filled 2011-06-20: qty 4

## 2011-06-20 MED ORDER — FERROUS SULFATE 325 (65 FE) MG PO TABS: 325.0000 mg | ORAL_TABLET | Freq: Every day | ORAL | Status: DC

## 2011-06-20 MED ORDER — ASPIRIN 81 MG PO CHEW: 81.0000 mg | CHEWABLE_TABLET | Freq: Every day | ORAL | Status: AC

## 2011-06-20 MED FILL — Dextrose Inj 5%: INTRAVENOUS | Qty: 50 | Status: AC

## 2011-06-20 NOTE — Progress Notes (Signed)
06/20/2011     9:18 AM            [facility]  No further bleeding.  Ok with Dr Allyson Sabal for pt to stop Effient for 7 days to allow for Colonoscopy.  When office reopens this AM I will schedule direct colonoscopy and preop RN Visit. Based on timing of these will give pt a date at which to stop Effient.  If date is in 2 to 3 weeks, then pt could restart the Effient in the interim.   Dr Allyson Sabal planning blood transfusion today and then d/c home.   I am adding Anusol HC, this ought to be used twice daily in outpt setting.   Will need to stop po Iron, just started today,  for one week before colonoscopy. I am changing the dose from TID to once daily.    Jennye Moccasin PA-C  Agree with plans as above. I have also reviewed with patient.  Iva Boop, MD, [facility] [phone] (pager) 06/20/2011 2:04 PM

## 2011-06-20 NOTE — Progress Notes (Signed)
Subjective: No chest pain.   Objective: Vital signs in last 24 hours: Temp:  [97.5 F (36.4 C)-98.6 F (37 C)] 98.1 F (36.7 C) (01/18 0403) Pulse Rate:  [50-59] 54  (01/18 0403) Resp:  [11-17] 16  (01/17 1549) BP: (99-147)/(55-72) 138/65 mmHg (01/18 0403) SpO2:  [96 %-100 %] 98 % (01/18 0403) Weight change:  Last BM Date: 06/19/11 Intake/Output from previous day: +915 01/17 0701 - 01/18 0700 In: 915 [P.O.:840; I.V.:75] Out: -  Intake/Output this shift: Total I/O In: 240 [P.O.:240] Out: -   PE: General:no complaints, pleasant affect. Pale in appearance. Heart:S1S2, rrr. Lungs:clear, without rales, rhonchi or wheezes. Abd:+BS, soft, non tender Ext:no edema, rt. Radial cath site with out hematoma, no pain.   Lab Results:  Basename 06/19/11 1234  WBC 7.3  HGB 8.5*  HCT 30.1*  PLT 352     Lab Results  Component Value Date   CHOL  Value: 163        ATP III CLASSIFICATION:  <200     mg/dL   Desirable  161-096  mg/dL   Borderline High  >=045    mg/dL   High        09/08/8117   HDL 55 10/26/2009   LDLCALC  Value: 84        Total Cholesterol/HDL:CHD Risk Coronary Heart Disease Risk Table                     Men   Women  1/2 Average Risk   3.4   3.3  Average Risk       5.0   4.4  2 X Average Risk   9.6   7.1  3 X Average Risk  23.4   11.0        Use the calculated Patient Ratio above and the CHD Risk Table to determine the patient's CHD Risk.        ATP III CLASSIFICATION (LDL):  <100     mg/dL   Optimal  147-829  mg/dL   Near or Above                    Optimal  130-159  mg/dL   Borderline  562-130  mg/dL   High  >865     mg/dL   Very High 7/84/6962   TRIG 120 10/26/2009   CHOLHDL 3.0 10/26/2009   Lab Results  Component Value Date   HGBA1C  Value: 6.3 (NOTE) The ADA recommends the following therapeutic goal for glycemic control related to Hgb A1c measurement: Goal of therapy: <6.5 Hgb A1c  Reference: American Diabetes Association: Clinical Practice Recommendations 2010, Diabetes  Care, 2010, 33: (Suppl  1).* 07/08/2009         EKG: Orders placed in visit on 06/19/11  . EKG 12-LEAD    Studies/Results: No results found.  Medications: I have reviewed the patient's current medications.    Marland Kitchen aspirin  81 mg Oral Daily  . bivalirudin      . diazepam  5 mg Oral On Call  . fentaNYL      . heparin      . heparin      . hydrochlorothiazide  12.5 mg Oral BID  . lidocaine      . lisinopril  10 mg Oral BID  . midazolam      . nebivolol  10 mg Oral Daily  . nitroGLYCERIN      . prasugrel  10 mg Oral Daily  .  rosuvastatin  20 mg Oral QHS  . testosterone  5 g Transdermal Daily  . verapamil      . verapamil      . DISCONTD: adenosine  16 mL Intravenous To Cath  . DISCONTD: aspirin EC  325 mg Oral Daily  . DISCONTD: lisinopril-hydrochlorothiazide  1 tablet Oral BID  . DISCONTD: prasugrel  10 mg Oral Daily   Assessment/Plan: Patient Active Problem List  Diagnoses  . Rectal bleeding  . Microcytic anemia  . Obesity (BMI 30-39.9)  . Type 2 diabetes mellitus  . CAD (coronary artery disease) , stents to LCX & distal RCA in acute setting 07/2009  . Anemia  . Abnormal stress test   PLAN: Dr. Leone Payor has seen the pt. Recommends colonoscopy.  Needs to be off Effient for 5-7  days.  CAD-with some ISR but will treat medically.  Will d/c naproxen. GI to schedule outpt. Study. Ambulating without problems.     LOS: 1 day   INGOLD,LAURA R 06/20/2011, 8:04 AM  Agree with note written by Nada Boozer RNP  Pt had noncritical CAD at cath yesterday. Prox Co-Dominant LCX stent had 50% 'ISR' with an FFR of .83. Etiol of anmeia still unclear. Can stop effient now for OP GI eval. Exam benign. Right radial puncture site OK. Hgb 8.5. Will transfuse 2 units of PRBCs because he is symptomatic and I do not feel comfortable with a HGB of 8.5 in a cardiac pt . Prob home later today. ROV with me 1-2 weeks.  Runell Gess 06/20/2011 8:56 AM

## 2011-06-20 NOTE — Discharge Summary (Signed)
Physician Discharge Summary  Patient ID: NATHANYEL DEFENBAUGH MRN: [id number] DOB/AGE: 01-Dec-1950 61 y.o.  Admit date: 06/19/2011 Discharge date: 06/20/2011  Discharge Diagnoses:  Active Problems:  CAD (coronary artery disease) , stents to LCX & distal RCA in acute setting 07/2009, now nonobstructive ISR will treat medically  Abnormal stress test  Rectal bleeding  Microcytic anemia  Obesity (BMI 30-39.9)  Type 2 diabetes mellitus  Anemia, transfusing 2 units PRBCs.   Discharged Condition: good  Procedure: Combined left heart cath and FFR by Dr. Nanetta Batty 06/19/2011  Hospital Course: 61 year old white male presented to our office 06/13/2011 with symptoms of dyspnea. He recently had a stress Myoview which revealed normal perfusion but went he exercise he had 2-3 mm of ST segment depression. Because of the depression and the symptoms patient was brought in electively for cardiac catheterization. Preprocedure labs his hemoglobin was 8.1 and hematocrit 29.4. He was admitted for procedure cardiac cath fortunately had nonobstructive disease with previous stents to his RCA and circumflex with 30% in-stent restenosis in the RCA and 50% in-stent stenosis of the circumflex stent he also had 50-60% stenosis distally the circumflex stent. These vessels were also evaluated with IVUS. FFR was 0.83 of the proximal left circumflex.  GI consult with Dr. Leone Payor was obtained.  Plans will be for outpatient colonoscopy while he is off as needed for 7 days. By mouth iron was added to his medical regimen for daily but this would need to be stopped one week prior to the colonoscopy and Anusol HC was added to his medical regimen for twice a day due to some rectal bleeding.  The day of discharge patient was still pale, dyspneic on exertion. Dr. Allyson Sabal from these for symptoms related to his anemia and we have transfused him 2 units of packed RBCs. This will be followed by 20 mg of IV Lasix. Once the blood has been fused  and patient receives the Lasix and follow CBC is done patient will be discharged home.  Patient is ambulating in the hallway without complications vital signs were stable and once blood has been infused, he is stable and will be discharged home. Dr. Allyson Sabal has seen and evaluated the patient.  Patient will follow with Dr. Allyson Sabal as well as Dr. Leone Payor for colonoscopy.  Consults: GI---Dr. Leone Payor.  Significant Diagnostic Studies:  Sodium 138 potassium 4.0 chloride 101 CO2 29 BUN 18 creatinine 1.01 calcium 9.3 glucose 146  Iron 13 TIBC 538 TIBC 551 saturation ratio 2 ferritin 10 folate greater than 20  Vitamin B12 level MCXXXII  Hemoglobin 8.5 hematocrit 30.1 through C7 0.3 and platelets 352  Followup CBC post transfusion is pending  Transfusion with 2 packs of rbc's with a positive blood and negative antibody screen.  Two-view chest x-ray was with no acute findings.  EKG sinus bradycardia heart rate 47 without acute ST changes 06/19/2011  Discharge Exam: Blood pressure 132/71, pulse 51, temperature 97.8 F (36.6 C), temperature source Oral, resp. rate 18, height 6' (1.829 m), weight 109.77 kg (242 lb), SpO2 97.00%.   PE: General:no complaints, pleasant affect. Pale in appearance.  Heart:S1S2, rrr.  Lungs:clear, without rales, rhonchi or wheezes.  Abd:+BS, soft, non tender  Ext:no edema, rt. Radial cath site with out hematoma, no pain.   Disposition: home Discharge Orders    Future Appointments: Provider: Department: Dept Phone: Center:   06/26/2011 10:30 AM [facility] Rm[facility] [phone] [facility]   06/30/2011 9:00 AM Iva Boop, MD [facility] [phone] [facility]  Current Discharge Medication List    START taking these medications   Details  acetaminophen (TYLENOL) 325 MG tablet Take 2 tablets (650 mg total) by mouth every 4 (four) hours as needed.    aspirin 81 MG chewable tablet Chew 1 tablet (81 mg total) by mouth daily.      hydrocortisone (ANUSOL-HC) 25 MG suppository Place 1 suppository (25 mg total) rectally 2 (two) times daily. Qty: 24 suppository, Refills: 1    nitroGLYCERIN (NITROSTAT) 0.4 MG SL tablet Place 1 tablet (0.4 mg total) under the tongue every 5 (five) minutes as needed for chest pain. Qty: 25 tablet, Refills: 4      CONTINUE these medications which have NOT CHANGED   Details  furosemide (LASIX) 20 MG tablet Take 20 mg by mouth daily as needed. For swelling    lisinopril-hydrochlorothiazide (PRINZIDE,ZESTORETIC) 10-12.5 MG per tablet Take 1 tablet by mouth 2 (two) times daily.    nebivolol (BYSTOLIC) 10 MG tablet Take 10 mg by mouth daily.    OVER THE COUNTER MEDICATION Take 1 tablet by mouth daily. Co-q-10    rosuvastatin (CRESTOR) 20 MG tablet Take 20 mg by mouth at bedtime.    testosterone (ANDROGEL) 50 MG/5GM GEL Place 5 g onto the skin daily.    zolpidem (AMBIEN) 10 MG tablet Take 10 mg by mouth at bedtime as needed. For sleep      STOP taking these medications     aspirin EC 325 MG tablet      naproxen sodium (ANAPROX) 220 MG tablet      prasugrel (EFFIENT) 10 MG TABS        Follow-up Information    Follow up with Runell Gess, MD on 07/02/2011. (At 2:15 pm.)    Contact information:   [address] [place] [address] [phone]       Follow up with Stan Head, MD on 06/26/2011. (nurse visit at gessner's office  1030 AM)    Contact information:   [address] [facility] [address] [place] [address] [place] [address] [phone]       Follow up with Stan Head, MD on 06/30/2011. (arrive 8 AM for colonoscopy)    Contact information:   [address] [facility] [address] [place] [address] [place] [address] [phone]        DISCHARGE INSTRUCTIONS: Take 1 NTG, under your tongue, while sitting.  If no relief of pain may repeat NTG, one tab every 5 minutes up to 3 tablets total over 15 minutes.  If no relief CALL  911.  If you have dizziness/lightheadness  while taking NTG, stop taking and call 911.        Diet: Heart Healthy Diabetic Diet.  Call [facility] if any bleeding, swelling or drainage at cath site.  May shower, no tub baths for 48 hours for groin sticks.   Hold the ferrous sulfate until after the colonoscopy.  You are scheduled for a colonoscopy 06/30/11, the nurse will come to your home and explain prep on the 24th.  SignedLeone Brand 06/20/2011, 5:17 PM

## 2011-06-20 NOTE — Discharge Summary (Signed)
Kyle Arroyo C. Utah Delauder, MD Attending Cardiologist [facility]  

## 2011-06-25 ENCOUNTER — Telehealth: Payer: Self-pay | Admitting: *Deleted

## 2011-06-25 NOTE — Telephone Encounter (Signed)
Writer noted that Dr. Leone Payor recommends that pt be off Effient for 7 days before the procedure.  His procedure is scheduled for 06-30-11.  Writer wanted to clarify if pt had stopped his Effient on 06-23-11.  No answer at his home, no machine to leave message.  Work number busy

## 2011-06-25 NOTE — Telephone Encounter (Signed)
Attempted to reach pt, no answer

## 2011-06-25 NOTE — Telephone Encounter (Signed)
Attempted to reach pt again, no answer.

## 2011-06-25 NOTE — Telephone Encounter (Signed)
No answer, no machine.

## 2011-06-26 ENCOUNTER — Encounter: Payer: Self-pay | Admitting: Internal Medicine

## 2011-06-26 ENCOUNTER — Ambulatory Visit (AMBULATORY_SURGERY_CENTER): Payer: BC Managed Care – PPO | Admitting: *Deleted

## 2011-06-26 VITALS — Ht 73.0 in | Wt 249.1 lb

## 2011-06-26 DIAGNOSIS — D509 Iron deficiency anemia, unspecified: Secondary | ICD-10-CM

## 2011-06-26 DIAGNOSIS — K625 Hemorrhage of anus and rectum: Secondary | ICD-10-CM

## 2011-06-26 MED ORDER — PEG-KCL-NACL-NASULF-NA ASC-C 100 G PO SOLR: ORAL | Status: DC

## 2011-06-26 NOTE — Progress Notes (Signed)
Pt says that Dr. Allyson Sabal stopped Effient on 1/17.  Pt has Rx for Ferrous Sulfate but has not started.  Will hold until after colonoscopy.  [name]

## 2011-06-30 ENCOUNTER — Encounter: Payer: Self-pay | Admitting: Internal Medicine

## 2011-06-30 ENCOUNTER — Ambulatory Visit (AMBULATORY_SURGERY_CENTER): Payer: BC Managed Care – PPO | Admitting: Internal Medicine

## 2011-06-30 VITALS — BP 151/88 | HR 57 | Temp 97.6°F | Resp 16 | Ht 73.0 in | Wt 249.0 lb

## 2011-06-30 DIAGNOSIS — D509 Iron deficiency anemia, unspecified: Secondary | ICD-10-CM

## 2011-06-30 DIAGNOSIS — K648 Other hemorrhoids: Secondary | ICD-10-CM

## 2011-06-30 DIAGNOSIS — D126 Benign neoplasm of colon, unspecified: Secondary | ICD-10-CM

## 2011-06-30 DIAGNOSIS — K625 Hemorrhage of anus and rectum: Secondary | ICD-10-CM

## 2011-06-30 MED ORDER — SODIUM CHLORIDE 0.9 % IV SOLN: 500.0000 mL | INTRAVENOUS | Status: DC

## 2011-06-30 NOTE — Op Note (Signed)
[facility] [address] [place], [place]  [address]  COLONOSCOPY PROCEDURE REPORT  PATIENT:  Vikash, Nest  MR#:  [id number] BIRTHDATE:  [birth year], 60 yrs. old  GENDER:  male ENDOSCOPIST:  Iva Boop, MD, [facility] REF. BY:  Runell Gess, M.D. PROCEDURE DATE:  06/30/2011 PROCEDURE:  Colonoscopy with biopsy ASA CLASS:  Class II INDICATIONS:  rectal bleeding, Iron deficiency anemia Chronic rectal bleeding and microcytic anemia in setting of Effient (held) and aspirin therapy for CAD. MEDICATIONS:   These medications were titrated to patient response per physician's verbal order, Fentanyl 50 mcg IV, Versed 6 mg IV  DESCRIPTION OF PROCEDURE:   After the risks benefits and alternatives of the procedure were thoroughly explained, informed consent was obtained.  Digital rectal exam was performed and revealed no rectal masses.   The [id number] endoscope was introduced through the anus and advanced to the cecum, which was identified by both the appendix and ileocecal valve, without limitations.  The quality of the prep was excellent, using MoviPrep.  The instrument was then slowly withdrawn as the colon was fully examined. <<PROCEDUREIMAGES>>  FINDINGS:  A diminutive polyp was found in the cecum. It was 2 mm in size. The polyp was removed using cold biopsy forceps.  This was otherwise a normal examination of the colon.   Retroflexed views in the rectum revealed internal hemorrhoids.    The time to cecum = 2:35 minutes. The scope was then withdrawn in 9:55 minutes from the cecum and the procedure completed. COMPLICATIONS:  None ENDOSCOPIC IMPRESSION: 1) 2 mm diminutive polyp in the cecum - removed 2) Otherwise normal examination of colon with excellent prep 3) Internal hemorrhoids RECOMMENDATIONS: 1) we will refer to general surgeon re: chronically bleeding hemorrhoids and anemia 2) ok to resume Effient - check with Dr. Allyson Sabal about this REPEAT EXAM:  In for  Colonoscopy, pending biopsy results.  Iva Boop, MD, [facility]  CC:  Nanetta Batty, MD, Holley Bouche, MD and The Patient  n. eSIGNED:   Iva Boop at 06/30/2011 09:42 AM  Margaret Pyle, [phone]

## 2011-06-30 NOTE — Progress Notes (Signed)
Patient did not experience any of the following events: a burn prior to discharge; a fall within the facility; wrong site/side/patient/procedure/implant event; or a hospital transfer or hospital admission upon discharge from the facility. (G8907) Patient did not have preoperative order for IV antibiotic SSI prophylaxis. (G8918)  

## 2011-06-30 NOTE — Patient Instructions (Addendum)
The colonoscopy showed one tiny polyp (removed) and internal hemorrhoids. I think the chronic bleeding from the hemorrhoids led to the anemia. I am going to set up an appointment with a surgeon to see if they can help you with the hemorrhoids. Contact Dr. Allyson Sabal about going back on the Effient - it is ok to resume from my perspective though the hemorrhoids may bleed more again. Iva Boop, MD, [facility]  Please refer to blue and green discharge instruction sheets and hand-outs.

## 2011-06-30 NOTE — Progress Notes (Signed)
Pt has two stents and he states he has shortness of breath if lays on his left side. ewm

## 2011-06-30 NOTE — Progress Notes (Signed)
Addended by: Doristine Church F on: 06/30/2011 10:22 AM   Modules accepted: Orders

## 2011-07-01 ENCOUNTER — Telehealth: Payer: Self-pay

## 2011-07-01 NOTE — Telephone Encounter (Signed)
  Follow up Call-  Call back number 06/30/2011  Post procedure Call Back phone  # [phone]  Permission to leave phone message Yes     Patient questions:  Do you have a fever, pain , or abdominal swelling? no Pain Score  0 *  Have you tolerated food without any problems? yes  Have you been able to return to your normal activities? yes  Do you have any questions about your discharge instructions: Diet   no Medications  no Follow up visit  no  Do you have questions or concerns about your Care? no  Actions: * If pain score is 4 or above: No action needed, pain <4.

## 2011-07-03 ENCOUNTER — Encounter: Payer: Self-pay | Admitting: Internal Medicine

## 2011-07-03 NOTE — Progress Notes (Signed)
Quick Note:  Small tubular adenoma (diminutive) Repeat routine colonoscopy in approx 5 years 06/2016 ______

## 2011-10-29 ENCOUNTER — Other Ambulatory Visit: Payer: Self-pay | Admitting: Orthopaedic Surgery

## 2011-10-29 DIAGNOSIS — M25522 Pain in left elbow: Secondary | ICD-10-CM

## 2011-11-02 ENCOUNTER — Ambulatory Visit
Admission: RE | Admit: 2011-11-02 | Discharge: 2011-11-02 | Disposition: A | Discharge status: Home / Self Care | Payer: BC Managed Care – PPO | Source: Ambulatory Visit | Attending: Orthopaedic Surgery | Admitting: Orthopaedic Surgery

## 2011-11-02 DIAGNOSIS — M25522 Pain in left elbow: Secondary | ICD-10-CM

## 2012-10-21 ENCOUNTER — Other Ambulatory Visit: Payer: Self-pay | Admitting: *Deleted

## 2012-10-21 MED ORDER — AMLODIPINE BESYLATE 5 MG PO TABS: 5.0000 mg | ORAL_TABLET | Freq: Every day | ORAL | Status: DC

## 2012-10-21 NOTE — Telephone Encounter (Signed)
MEDICATION REFILL FOR AMLODIPINE

## 2013-03-11 ENCOUNTER — Other Ambulatory Visit: Payer: Self-pay | Admitting: *Deleted

## 2013-03-14 ENCOUNTER — Telehealth: Payer: Self-pay | Admitting: Cardiovascular Disease

## 2013-03-14 MED ORDER — EZETIMIBE 10 MG PO TABS: 10.0000 mg | ORAL_TABLET | Freq: Every day | ORAL | Status: DC

## 2013-03-14 NOTE — Telephone Encounter (Signed)
Rx was sent to pharmacy electronically. Called patient to clarify medication - was originally prescribed by Vernona Rieger, NP per patient.

## 2013-03-14 NOTE — Telephone Encounter (Signed)
Kyle Arroyo called him Friday about rx refill insurance company has a question about

## 2013-03-14 NOTE — Addendum Note (Signed)
Addended by: Lindell Spar on: 03/14/2013 11:29 AM   Modules accepted: Orders

## 2013-04-04 ENCOUNTER — Other Ambulatory Visit: Payer: Self-pay

## 2013-04-04 MED ORDER — ROSUVASTATIN CALCIUM 20 MG PO TABS: 20.0000 mg | ORAL_TABLET | Freq: Every day | ORAL | Status: DC

## 2013-04-04 NOTE — Telephone Encounter (Signed)
Rx was sent to pharmacy electronically. 

## 2013-08-22 ENCOUNTER — Other Ambulatory Visit: Payer: Self-pay

## 2013-08-22 MED ORDER — PRASUGREL HCL 10 MG PO TABS: 10.0000 mg | ORAL_TABLET | Freq: Every day | ORAL | Status: DC

## 2013-08-22 NOTE — Telephone Encounter (Signed)
Rx was sent to pharmacy electronically. 

## 2013-10-19 ENCOUNTER — Other Ambulatory Visit: Payer: Self-pay | Admitting: Cardiovascular Disease

## 2013-10-19 MED ORDER — AMLODIPINE BESYLATE 5 MG PO TABS: 5.0000 mg | ORAL_TABLET | Freq: Every day | ORAL | Status: DC

## 2013-11-21 ENCOUNTER — Other Ambulatory Visit: Payer: Self-pay | Admitting: Cardiovascular Disease

## 2013-11-22 ENCOUNTER — Other Ambulatory Visit: Payer: Self-pay | Admitting: *Deleted

## 2013-11-22 MED ORDER — ROSUVASTATIN CALCIUM 40 MG PO TABS: 40.0000 mg | ORAL_TABLET | Freq: Every day | ORAL | Status: DC

## 2013-11-22 NOTE — Telephone Encounter (Signed)
Rx refill sent to pharmacy. 

## 2013-12-28 ENCOUNTER — Other Ambulatory Visit: Payer: Self-pay | Admitting: Cardiovascular Disease

## 2013-12-30 ENCOUNTER — Other Ambulatory Visit: Payer: Self-pay | Admitting: *Deleted

## 2013-12-30 MED ORDER — AMLODIPINE BESYLATE 5 MG PO TABS: 5.0000 mg | ORAL_TABLET | Freq: Every day | ORAL | Status: DC

## 2013-12-30 MED ORDER — PRASUGREL HCL 10 MG PO TABS: 10.0000 mg | ORAL_TABLET | Freq: Every day | ORAL | Status: DC

## 2013-12-30 NOTE — Telephone Encounter (Signed)
Rx was sent to pharmacy electronically. Left message that refills were sent in to local pharmacy and that he needs appmt and will be contacted by scheduler.

## 2013-12-30 NOTE — Telephone Encounter (Signed)
Need a new prescription for Effient 10 mg #30 and Amlodipine 5 mg # 30. Please call to [place] need these until his mail order comes in.

## 2014-01-04 ENCOUNTER — Telehealth: Payer: Self-pay | Admitting: Cardiovascular Disease

## 2014-01-05 NOTE — Telephone Encounter (Signed)
Closed encounter °

## 2014-02-28 ENCOUNTER — Encounter: Payer: Self-pay | Admitting: Cardiovascular Disease

## 2014-02-28 ENCOUNTER — Ambulatory Visit (INDEPENDENT_AMBULATORY_CARE_PROVIDER_SITE_OTHER): Payer: BC Managed Care – PPO | Admitting: Cardiovascular Disease

## 2014-02-28 VITALS — BP 160/100 | HR 49 | Ht 72.0 in | Wt 253.4 lb

## 2014-02-28 DIAGNOSIS — I2510 Atherosclerotic heart disease of native coronary artery without angina pectoris: Secondary | ICD-10-CM

## 2014-02-28 DIAGNOSIS — E785 Hyperlipidemia, unspecified: Secondary | ICD-10-CM | POA: Insufficient documentation

## 2014-02-28 DIAGNOSIS — I10 Essential (primary) hypertension: Secondary | ICD-10-CM

## 2014-02-28 MED ORDER — AMLODIPINE BESYLATE 5 MG PO TABS
5.0000 mg | ORAL_TABLET | Freq: Every day | ORAL | Status: DC
Start: 2014-02-28 — End: 2014-06-16

## 2014-02-28 MED ORDER — PRASUGREL HCL 10 MG PO TABS: 10.0000 mg | ORAL_TABLET | Freq: Every day | ORAL | Status: DC

## 2014-02-28 MED ORDER — EZETIMIBE 10 MG PO TABS: 10.0000 mg | ORAL_TABLET | Freq: Every day | ORAL | Status: DC

## 2014-02-28 MED ORDER — ROSUVASTATIN CALCIUM 20 MG PO TABS: 20.0000 mg | ORAL_TABLET | Freq: Every day | ORAL | Status: DC

## 2014-02-28 MED ORDER — NEBIVOLOL HCL 10 MG PO TABS: 10.0000 mg | ORAL_TABLET | Freq: Every day | ORAL | Status: DC

## 2014-02-28 MED ORDER — LISINOPRIL-HYDROCHLOROTHIAZIDE 10-12.5 MG PO TABS: 1.0000 | ORAL_TABLET | Freq: Two times a day (BID) | ORAL | Status: DC

## 2014-02-28 NOTE — Assessment & Plan Note (Signed)
On statin therapy followed by his PCP 

## 2014-02-28 NOTE — Assessment & Plan Note (Signed)
History of CAD status post previous stenting to the RCA and circumflex. Performed cardiac catheterization on him including FFR of his circumflex stent which measure 23 suggesting that this was not hemogram of a significant and medical therapy was recommended. He denies chest pain or shortness of breath.

## 2014-02-28 NOTE — Patient Instructions (Signed)
Your physician wants you to follow-up in: 1 year with Dr Berry. You will receive a reminder letter in the mail two months in advance. If you don't receive a letter, please call our office to schedule the follow-up appointment.  

## 2014-02-28 NOTE — Assessment & Plan Note (Signed)
Controlled on current medications 

## 2014-02-28 NOTE — Progress Notes (Signed)
02/28/2014 Kyle Arroyo   1950-11-04  [id number]  Primary Physician Kyle Frees, MD Primary Cardiologist: Kyle Harp MD Kyle Arroyo   HPI:  He is a 63 year old moderately overweight married Caucasian male, father of 40, grandfather to one grandchild, has a history of CAD status post stenting of his proximal circumflex and distal right coronary artery in the setting of acute coronary syndrome in February of 2011. His other problems include discontinue tobacco abuse at that time, hypertension and hyperlipidemia. He was recatheterized by Dr. Georgina Arroyo May of 2000 Kyle Arroyo in the setting of acute coronary syndrome with documented patent circumflex stent and high-grade in-stent restenosis and put within the distal right bony artery stent which Dr. Claiborne Arroyo reinterpreting dawn. He has been complaining of increasing dyspnea on exertion or lower extremity edema for the prior 2 weeks and was aware of salt restriction. I obtained a 2-D echo which showed essentially normal bili function and a Myoview stress test that showed normal perfusion images. However, on exercise he did have 2-3 mm of ST segment depression. In addition, his preprocedure labs were remarkable for hemoglobin of 8.1 which was a new finding. Because of the symptoms of his abnormal electrocardiographic response to exercise he underwent cardiac catheterization by myself 06/19/11 revealed patent stents with moderate disease in the circumflex. I performed FFR on this revealing it to be not physiologically significant and medical therapy was recommended. Since I saw him back February 2014 days remained stable without chest pain or shortness of breath.  Current Outpatient Prescriptions  Medication Sig Dispense Refill  . amLODipine (NORVASC) 5 MG tablet Take 1 tablet (5 mg total) by mouth daily.  90 tablet  1  . aspirin 325 MG tablet Take 325 mg by mouth daily.      . Cyanocobalamin ([employer] B-12 PO) Take 3 mg by mouth daily.      Marland Kitchen  ezetimibe (ZETIA) 10 MG tablet Take 1 tablet (10 mg total) by mouth daily.  90 tablet  2  . ferrous sulfate 325 (65 FE) MG tablet Take 325 mg by mouth 3 (three) times daily with meals.      . furosemide (LASIX) 20 MG tablet Take 20 mg by mouth daily as needed. For swelling      . LevOCARNitine L-Tartrate (L-CARNITINE) 500 MG CAPS Take 1,000 mg by mouth daily.      Marland Kitchen lisinopril-hydrochlorothiazide (PRINZIDE,ZESTORETIC) 10-12.5 MG per tablet Take 1 tablet by mouth 2 (two) times daily.      . nebivolol (BYSTOLIC) 10 MG tablet Take 10 mg by mouth daily.      . Omega 3 1000 MG CAPS Take 1 capsule by mouth daily.      Marland Kitchen OVER THE COUNTER MEDICATION Take 1 tablet by mouth daily. Co-q-10      . prasugrel (EFFIENT) 10 MG TABS tablet Take 1 tablet (10 mg total) by mouth daily. <please make appointment for refills>  30 tablet  1  . rosuvastatin (CRESTOR) 20 MG tablet Take 1 tablet (20 mg total) by mouth at bedtime.  90 tablet  0  . testosterone (ANDROGEL) 50 MG/5GM GEL Place 5 g onto the skin daily.      . nitroGLYCERIN (NITROSTAT) 0.4 MG SL tablet Place 1 tablet (0.4 mg total) under the tongue every 5 (five) minutes as needed for chest pain.  25 tablet  4   No current facility-administered medications for this visit.    Allergies  Allergen Reactions  . Levofloxacin Shortness Of Breath  and Swelling    Severe swelling of legs and testicles    History   Social History  . Marital Status: Single    Spouse Name: N/A    Number of Children: N/A  . Years of Education: N/A   Occupational History  . Not on file.   Social History Main Topics  . Smoking status: Former Smoker    Quit date: 06/25/2008  . Smokeless tobacco: Never Used     Comment: quit in 2010  . Alcohol Use: Yes     Comment: occasional  . Drug Use: No  . Sexual Activity: Yes   Other Topics Concern  . Not on file   Social History Narrative  . No narrative on file     Review of Systems: General: negative for chills, fever,  night sweats or weight changes.  Cardiovascular: negative for chest pain, dyspnea on exertion, edema, orthopnea, palpitations, paroxysmal nocturnal dyspnea or shortness of breath Dermatological: negative for rash Respiratory: negative for cough or wheezing Urologic: negative for hematuria Abdominal: negative for nausea, vomiting, diarrhea, bright red blood per rectum, melena, or hematemesis Neurologic: negative for visual changes, syncope, or dizziness All other systems reviewed and are otherwise negative except as noted above.    Blood pressure 160/100, pulse 49, height 6' (1.829 m), weight 253 lb 6.4 oz (114.941 kg).  General appearance: alert and no distress Neck: no adenopathy, no carotid bruit, no JVD, supple, symmetrical, trachea midline and thyroid not enlarged, symmetric, no tenderness/mass/nodules Lungs: clear to auscultation bilaterally Heart: regular rate and rhythm, S1, S2 normal, no murmur, click, rub or gallop Extremities: extremities normal, atraumatic, no cyanosis or edema  EKG sinus bradycardia 49 without ST or T wave changes  ASSESSMENT AND PLAN:   Hyperlipidemia On statin therapy followed by his PCP  Essential hypertension Controlled on current medications  CAD (coronary artery disease) , stents to LCX & distal RCA in acute setting 07/2009, now nonobstructive ISR will treat medically History of CAD status post previous stenting to the RCA and circumflex. Performed cardiac catheterization on him including FFR of his circumflex stent which measure 23 suggesting that this was not hemogram of a significant and medical therapy was recommended. He denies chest pain or shortness of breath.      Kyle Harp MD FACP,FACC,FAHA, [facility] 02/28/2014 12:25 PM

## 2014-05-11 ENCOUNTER — Encounter (HOSPITAL_COMMUNITY): Payer: Self-pay | Admitting: Cardiovascular Disease

## 2014-06-16 ENCOUNTER — Other Ambulatory Visit: Payer: Self-pay | Admitting: *Deleted

## 2014-06-16 MED ORDER — PRASUGREL HCL 10 MG PO TABS: 10.0000 mg | ORAL_TABLET | Freq: Every day | ORAL | Status: DC

## 2014-06-16 MED ORDER — AMLODIPINE BESYLATE 5 MG PO TABS
5.0000 mg | ORAL_TABLET | Freq: Every day | ORAL | Status: DC
Start: 2014-06-16 — End: 2014-06-16

## 2014-06-16 MED ORDER — AMLODIPINE BESYLATE 5 MG PO TABS: 5.0000 mg | ORAL_TABLET | Freq: Every day | ORAL | Status: DC

## 2014-06-19 ENCOUNTER — Other Ambulatory Visit: Payer: Self-pay

## 2014-06-19 MED ORDER — AMLODIPINE BESYLATE 5 MG PO TABS: 5.0000 mg | ORAL_TABLET | Freq: Every day | ORAL | Status: DC

## 2014-06-19 NOTE — Telephone Encounter (Signed)
Rx sent to pharmacy   

## 2014-12-20 ENCOUNTER — Encounter: Payer: Self-pay | Admitting: Cardiovascular Disease

## 2015-01-05 ENCOUNTER — Other Ambulatory Visit: Payer: Self-pay

## 2015-01-05 MED ORDER — PRASUGREL HCL 10 MG PO TABS: 10.0000 mg | ORAL_TABLET | Freq: Every day | ORAL | Status: DC

## 2015-01-05 NOTE — Telephone Encounter (Signed)
Rx(s) sent to pharmacy electronically.  

## 2015-03-28 ENCOUNTER — Other Ambulatory Visit: Payer: Self-pay | Admitting: Cardiovascular Disease

## 2015-05-28 ENCOUNTER — Other Ambulatory Visit: Payer: Self-pay | Admitting: Cardiovascular Disease

## 2015-06-03 ENCOUNTER — Other Ambulatory Visit: Payer: Self-pay | Admitting: Cardiovascular Disease

## 2015-06-05 ENCOUNTER — Telehealth: Payer: Self-pay | Admitting: Cardiovascular Disease

## 2015-06-05 MED ORDER — PRASUGREL HCL 10 MG PO TABS: 10.0000 mg | ORAL_TABLET | Freq: Every day | ORAL | Status: DC

## 2015-06-05 MED ORDER — AMLODIPINE BESYLATE 5 MG PO TABS: 5.0000 mg | ORAL_TABLET | Freq: Every day | ORAL | Status: DC

## 2015-06-05 MED ORDER — LISINOPRIL-HYDROCHLOROTHIAZIDE 10-12.5 MG PO TABS: 1.0000 | ORAL_TABLET | Freq: Two times a day (BID) | ORAL | Status: DC

## 2015-06-05 MED ORDER — ROSUVASTATIN CALCIUM 20 MG PO TABS: 20.0000 mg | ORAL_TABLET | Freq: Every day | ORAL | Status: DC

## 2015-06-05 NOTE — Telephone Encounter (Signed)
Spoke with pt, overdue follow up appt made. Refill sent to the pharmacy electronically.

## 2015-06-05 NOTE — Telephone Encounter (Signed)
°*  STAT* If patient is at the pharmacy, call can be transferred to refill team.   1. Which medications need to be refilled? (please list name of each medication and dose if known) Amlodipine,generic Crestor,Lisinipril andEffient  2. Which pharmacy/location (including street and city if local pharmacy) is medication to be sent to?[phone]  3. Do they need a 30 day or 90 day supply?90 and refills

## 2015-07-06 ENCOUNTER — Ambulatory Visit (INDEPENDENT_AMBULATORY_CARE_PROVIDER_SITE_OTHER): Payer: 59 | Admitting: Cardiovascular Disease

## 2015-07-06 ENCOUNTER — Encounter: Payer: Self-pay | Admitting: Cardiovascular Disease

## 2015-07-06 VITALS — BP 140/82 | HR 60 | Ht 76.0 in | Wt 254.5 lb

## 2015-07-06 DIAGNOSIS — E785 Hyperlipidemia, unspecified: Secondary | ICD-10-CM | POA: Diagnosis not present

## 2015-07-06 DIAGNOSIS — I2510 Atherosclerotic heart disease of native coronary artery without angina pectoris: Secondary | ICD-10-CM | POA: Diagnosis not present

## 2015-07-06 DIAGNOSIS — I10 Essential (primary) hypertension: Secondary | ICD-10-CM | POA: Diagnosis not present

## 2015-07-06 NOTE — Progress Notes (Signed)
07/06/2015 Kyle Arroyo   1950-08-07  [id number]  Primary Physician Shirline Frees, MD Primary Cardiologist: Lorretta Harp MD Renae Gloss   HPI:  He is a 65 year old moderately overweight married Caucasian male, father of 60, grandfather to one grandchild, has a history of CAD status post stenting of his proximal circumflex and distal right coronary artery in the setting of acute coronary syndrome in February of 2011.I last saw him in the office 02/28/14. His other problems include discontinue tobacco abuse at that time, hypertension and hyperlipidemia. He was recatheterized by Dr. Claiborne Billings 5/27/11n the setting of acute coronary syndrome with documented patent circumflex stent and high-grade in-stent restenosis and put within the distal right coronary artery stent. Dr. Claiborne Billings performed cutting balloon angioplasty with an excellent result. He has been complaining of increasing dyspnea on exertion or lower extremity edema for the prior 2 weeks and was aware of salt restriction. I obtained a 2-D echo which showed essentially normal bili function and a Myoview stress test that showed normal perfusion images. However, on exercise he did have 2-3 mm of ST segment depression. In addition, his preprocedure labs were remarkable for hemoglobin of 8.1 which was a new finding. He was transfused resulted in marked improvement in his symptoms.Because of the symptoms of his abnormal electrocardiographic response to exercise he underwent cardiac catheterization by myself 06/19/11 revealed patent stents with moderate disease in the circumflex. I performed FFR on this revealing it to be not physiologically significant and medical therapy was recommended. Since I saw him back 02/28/14  He has remained stable without chest pain or shortness of breath.   Current Outpatient Prescriptions  Medication Sig Dispense Refill  . amLODipine (NORVASC) 5 MG tablet Take 1 tablet (5 mg total) by mouth daily. 90 tablet 0    . aspirin 325 MG tablet Take 325 mg by mouth daily.    Marland Kitchen ezetimibe (ZETIA) 10 MG tablet Take 1 tablet (10 mg total) by mouth daily. 90 tablet 3  . LevOCARNitine L-Tartrate (L-CARNITINE) 500 MG CAPS Take 1,000 mg by mouth daily.    Marland Kitchen lisinopril-hydrochlorothiazide (PRINZIDE,ZESTORETIC) 10-12.5 MG tablet Take 1 tablet by mouth 2 (two) times daily. 180 tablet 0  . nebivolol (BYSTOLIC) 10 MG tablet Take 1 tablet (10 mg total) by mouth daily. 90 tablet 3  . Omega 3 1000 MG CAPS Take 1 capsule by mouth daily.    Marland Kitchen OVER THE COUNTER MEDICATION Take 1 tablet by mouth daily. Co-q-10    . prasugrel (EFFIENT) 10 MG TABS tablet Take 1 tablet (10 mg total) by mouth daily. 90 tablet 0  . rosuvastatin (CRESTOR) 20 MG tablet Take 1 tablet (20 mg total) by mouth daily. 90 tablet 0  . testosterone (ANDROGEL) 50 MG/5GM GEL Place 5 g onto the skin daily.    . nitroGLYCERIN (NITROSTAT) 0.4 MG SL tablet Place 1 tablet (0.4 mg total) under the tongue every 5 (five) minutes as needed for chest pain. 25 tablet 4   No current facility-administered medications for this visit.    Allergies  Allergen Reactions  . Levofloxacin Shortness Of Breath and Swelling    Severe swelling of legs and testicles    Social History   Social History  . Marital Status: Single    Spouse Name: N/A  . Number of Children: N/A  . Years of Education: N/A   Occupational History  . Not on file.   Social History Main Topics  . Smoking status: Former Smoker  Quit date: 06/25/2008  . Smokeless tobacco: Never Used     Comment: quit in 2010  . Alcohol Use: Yes     Comment: occasional  . Drug Use: No  . Sexual Activity: Yes   Other Topics Concern  . Not on file   Social History Narrative     Review of Systems: General: negative for chills, fever, night sweats or weight changes.  Cardiovascular: negative for chest pain, dyspnea on exertion, edema, orthopnea, palpitations, paroxysmal nocturnal dyspnea or shortness of  breath Dermatological: negative for rash Respiratory: negative for cough or wheezing Urologic: negative for hematuria Abdominal: negative for nausea, vomiting, diarrhea, bright red blood per rectum, melena, or hematemesis Neurologic: negative for visual changes, syncope, or dizziness All other systems reviewed and are otherwise negative except as noted above.    Blood pressure 140/82, pulse 60, height 6\' 4"  (1.93 m), weight 254 lb 8 oz (115.44 kg).  General appearance: alert and no distress Neck: no adenopathy, no carotid bruit, no JVD, supple, symmetrical, trachea midline and thyroid not enlarged, symmetric, no tenderness/mass/nodules Lungs: clear to auscultation bilaterally Heart: regular rate and rhythm, S1, S2 normal, no murmur, click, rub or gallop Extremities: extremities normal, atraumatic, no cyanosis or edema  EKG sinus bradycardia at 57 with no ST or T-wave changes. I proceeded reviewed this EKG  ASSESSMENT AND PLAN:   CAD (coronary artery disease) , stents to LCX & distal RCA in acute setting 07/2009, now nonobstructive ISR will treat medically History of CAD status post stenting of his proximal circumflex and distal right coronary artery the setting of acute coronary syndrome February 2011. He was recatheterized by Dr. Claiborne Billings 10/26/09 revealing high-grade in-stent restenosis within the distal RCA stent which underwent re-intervention with cutting balloon angioplasty. I recatheterized him 06/19/11 revealing patent stents with moderate distal circumflex disease. I performed FFR on his revealing it to be not physiologically significant and therefore medical therapy was recommended. He currently exercises frequently and denies chest pain or shortness of breath.  Essential hypertension History of hypertension blood pressure measured at 140/82. He is on amlodipine, lisinopril, hydrochlorothiazide and Bystolic . Continue current meds at current dosing  Hyperlipidemia History of  hyperlipidemia on Crestor followed by his PCP      Lorretta Harp MD [facility], [facility] 07/06/2015 8:58 AM

## 2015-07-06 NOTE — Assessment & Plan Note (Signed)
History of hypertension blood pressure measured at 140/82. He is on amlodipine, lisinopril, hydrochlorothiazide and Bystolic . Continue current meds at current dosing

## 2015-07-06 NOTE — Assessment & Plan Note (Signed)
History of CAD status post stenting of his proximal circumflex and distal right coronary artery the setting of acute coronary syndrome February 2011. He was recatheterized by Dr. Claiborne Billings 10/26/09 revealing high-grade in-stent restenosis within the distal RCA stent which underwent re-intervention with cutting balloon angioplasty. I recatheterized him 06/19/11 revealing patent stents with moderate distal circumflex disease. I performed FFR on his revealing it to be not physiologically significant and therefore medical therapy was recommended. He currently exercises frequently and denies chest pain or shortness of breath.

## 2015-07-06 NOTE — Assessment & Plan Note (Signed)
History of hyperlipidemia on Crestor followed by his PCP 

## 2015-07-06 NOTE — Patient Instructions (Signed)

## 2015-08-08 ENCOUNTER — Other Ambulatory Visit: Payer: Self-pay | Admitting: Cardiovascular Disease

## 2015-08-08 NOTE — Telephone Encounter (Signed)
Rx(s) sent to pharmacy electronically.  

## 2015-11-19 ENCOUNTER — Other Ambulatory Visit: Payer: Self-pay | Admitting: Cardiovascular Disease

## 2015-11-19 NOTE — Telephone Encounter (Signed)
Rx(s) sent to pharmacy electronically.  

## 2015-11-28 ENCOUNTER — Other Ambulatory Visit: Payer: Self-pay | Admitting: Cardiovascular Disease

## 2015-11-28 NOTE — Telephone Encounter (Signed)
Rx(s) sent to pharmacy electronically.  

## 2016-05-16 ENCOUNTER — Other Ambulatory Visit: Payer: Self-pay | Admitting: Cardiovascular Disease

## 2016-07-19 ENCOUNTER — Other Ambulatory Visit: Payer: Self-pay | Admitting: Cardiovascular Disease

## 2016-07-25 ENCOUNTER — Ambulatory Visit (INDEPENDENT_AMBULATORY_CARE_PROVIDER_SITE_OTHER): Payer: 59 | Admitting: Cardiovascular Disease

## 2016-07-25 ENCOUNTER — Encounter: Payer: Self-pay | Admitting: Cardiovascular Disease

## 2016-07-25 VITALS — BP 167/90 | HR 52 | Ht 72.0 in | Wt 265.2 lb

## 2016-07-25 DIAGNOSIS — I10 Essential (primary) hypertension: Secondary | ICD-10-CM | POA: Diagnosis not present

## 2016-07-25 DIAGNOSIS — I2510 Atherosclerotic heart disease of native coronary artery without angina pectoris: Secondary | ICD-10-CM | POA: Diagnosis not present

## 2016-07-25 DIAGNOSIS — E7800 Pure hypercholesterolemia, unspecified: Secondary | ICD-10-CM

## 2016-07-25 NOTE — Assessment & Plan Note (Signed)
History of hyperlipidemia on Crestor followed by his PCP 

## 2016-07-25 NOTE — Assessment & Plan Note (Signed)
History of hypertension blood pressure measures 167/90. He is on amlodipine, lisinopril, hydrochlorothiazide and Bystolic . He says his blood pressure is much lower than this at home. Continue current meds at current dosing

## 2016-07-25 NOTE — Progress Notes (Signed)
07/25/2016 Kyle Arroyo   1950-07-03  [id number]  Primary Physician Shirline Frees, MD Primary Cardiologist: Lorretta Harp MD Renae Gloss  HPI:  He is a 66 year old moderately overweight married Caucasian male, father of 24, grandfather to one grandchild who I last saw in the office 07/06/15.He  has a history of CAD status post stenting of his proximal circumflex and distal right coronary artery in the setting of acute coronary syndrome in February of 2011.I last saw him in the office 02/28/14. His other problems include discontinue tobacco abuse at that time, hypertension and hyperlipidemia. He was recatheterized by Dr. Claiborne Billings 5/27/11n the setting of acute coronary syndrome with documented patent circumflex stent and high-grade in-stent restenosis and put within the distal right coronary artery stent. Dr. Claiborne Billings performed cutting balloon angioplasty with an excellent result. He has been complaining of increasing dyspnea on exertion or lower extremity edema for the prior 2 weeks and was aware of salt restriction. I obtained a 2-D echo which showed essentially normal bili function and a Myoview stress test that showed normal perfusion images. However, on exercise he did have 2-3 mm of ST segment depression. In addition, his preprocedure labs were remarkable for hemoglobin of 8.1 which was a new finding. He was transfused resulted in marked improvement in his symptoms.Because of the symptoms of his abnormal electrocardiographic response to exercise he underwent cardiac catheterization by myself 06/19/11 revealed patent stents with moderate disease in the circumflex. I performed FFR on this revealing it to be not physiologically significant and medical therapy was recommended. Since I saw him back 02/28/14  He has remained stable without chest pain or shortness of breath.   Current Outpatient Prescriptions  Medication Sig Dispense Refill  . amLODipine (NORVASC) 5 MG tablet Take 1 tablet by  mouth  daily 90 tablet 2  . aspirin 325 MG tablet Take 325 mg by mouth daily.    Marland Kitchen EFFIENT 10 MG TABS tablet TAKE 1 TABLET BY MOUTH  DAILY 90 tablet 0  . ezetimibe (ZETIA) 10 MG tablet Take 1 tablet (10 mg total) by mouth daily. 90 tablet 3  . lisinopril-hydrochlorothiazide (PRINZIDE,ZESTORETIC) 10-12.5 MG tablet TAKE 1 TABLET BY MOUTH TWO  TIMES DAILY 180 tablet 3  . nebivolol (BYSTOLIC) 10 MG tablet Take 1 tablet (10 mg total) by mouth daily. 90 tablet 3  . Omega 3 1000 MG CAPS Take 1 capsule by mouth daily.    Marland Kitchen OVER THE COUNTER MEDICATION Take 1 tablet by mouth daily. Co-q-10    . rosuvastatin (CRESTOR) 20 MG tablet TAKE 1 TABLET BY MOUTH  DAILY 90 tablet 3  . nitroGLYCERIN (NITROSTAT) 0.4 MG SL tablet Place 1 tablet (0.4 mg total) under the tongue every 5 (five) minutes as needed for chest pain. 25 tablet 4   No current facility-administered medications for this visit.     Allergies  Allergen Reactions  . Levofloxacin Shortness Of Breath and Swelling    Severe swelling of legs and testicles    Social History   Social History  . Marital status: Single    Spouse name: N/A  . Number of children: N/A  . Years of education: N/A   Occupational History  . Not on file.   Social History Main Topics  . Smoking status: Former Smoker    Quit date: 06/25/2008  . Smokeless tobacco: Never Used     Comment: quit in 2010  . Alcohol use Yes     Comment: occasional  .  Drug use: No  . Sexual activity: Yes   Other Topics Concern  . Not on file   Social History Narrative  . No narrative on file     Review of Systems: General: negative for chills, fever, night sweats or weight changes.  Cardiovascular: negative for chest pain, dyspnea on exertion, edema, orthopnea, palpitations, paroxysmal nocturnal dyspnea or shortness of breath Dermatological: negative for rash Respiratory: negative for cough or wheezing Urologic: negative for hematuria Abdominal: negative for nausea, vomiting,  diarrhea, bright red blood per rectum, melena, or hematemesis Neurologic: negative for visual changes, syncope, or dizziness All other systems reviewed and are otherwise negative except as noted above.    Blood pressure (!) 167/90, pulse (!) 52, height 6' (1.829 m), weight 265 lb 3.2 oz (120.3 kg).  General appearance: alert and no distress Neck: no adenopathy, no carotid bruit, no JVD, supple, symmetrical, trachea midline and thyroid not enlarged, symmetric, no tenderness/mass/nodules Lungs: clear to auscultation bilaterally Heart: regular rate and rhythm, S1, S2 normal, no murmur, click, rub or gallop Extremities: extremities normal, atraumatic, no cyanosis or edema  EKG sinus bradycardia 52/nonspecific ST-T wave changes. I personally reviewed this EKG  ASSESSMENT AND PLAN:   CAD (coronary artery disease) , stents to LCX & distal RCA in acute setting 07/2009, now nonobstructive ISR will treat medically History of CAD status post circumflex stenting and distal RCA intervention in the setting of acute coronary syndrome by myself. 2011. Dr. Claiborne Billings performed repeat cardiac catheterization in the Setting of Acute Coronary Syndrome Documented a Patent Circumflex Stent with High-Grade In-Stent Restenosis within the Distal RCA Stent Which Underwent Cutting Balloon Angioplasty with Excellent Results. He had recurrent symptoms and had a positive GXT in the setting of a hemoglobin of 8.1. He was transfused and his symptoms resolved. I did recath him 06/19/11 revealing moderate disease within the circumflex. I performed FFR interrogation of this which revealed it to be not physiologically significant. He's been a symptomatic since.  Essential hypertension History of hypertension blood pressure measures 167/90. He is on amlodipine, lisinopril, hydrochlorothiazide and Bystolic . He says his blood pressure is much lower than this at home. Continue current meds at current dosing  Hyperlipidemia History of  hyperlipidemia on Crestor followed by his PCP.      Lorretta Harp MD FACP,FACC,FAHA, [facility] 07/25/2016 10:56 AM

## 2016-07-25 NOTE — Assessment & Plan Note (Signed)
History of CAD status post circumflex stenting and distal RCA intervention in the setting of acute coronary syndrome by myself. 2011. Dr. Claiborne Billings performed repeat cardiac catheterization in the Setting of Acute Coronary Syndrome Documented a Patent Circumflex Stent with High-Grade In-Stent Restenosis within the Distal RCA Stent Which Underwent Cutting Balloon Angioplasty with Excellent Results. He had recurrent symptoms and had a positive GXT in the setting of a hemoglobin of 8.1. He was transfused and his symptoms resolved. I did recath him 06/19/11 revealing moderate disease within the circumflex. I performed FFR interrogation of this which revealed it to be not physiologically significant. He's been a symptomatic since.

## 2016-07-25 NOTE — Patient Instructions (Signed)

## 2016-08-01 ENCOUNTER — Other Ambulatory Visit: Payer: Self-pay | Admitting: Cardiovascular Disease

## 2016-08-07 ENCOUNTER — Other Ambulatory Visit: Payer: Self-pay | Admitting: Cardiovascular Disease

## 2017-01-25 ENCOUNTER — Encounter (HOSPITAL_BASED_OUTPATIENT_CLINIC_OR_DEPARTMENT_OTHER): Payer: Self-pay | Admitting: Emergency Medicine

## 2017-01-25 ENCOUNTER — Emergency Department (HOSPITAL_BASED_OUTPATIENT_CLINIC_OR_DEPARTMENT_OTHER)
Admission: EM | Admit: 2017-01-25 | Discharge: 2017-01-25 | Disposition: A | Discharge status: Home / Self Care | Payer: 59 | Attending: Emergency Medicine | Admitting: Emergency Medicine

## 2017-01-25 DIAGNOSIS — Z79899 Other long term (current) drug therapy: Secondary | ICD-10-CM | POA: Insufficient documentation

## 2017-01-25 DIAGNOSIS — Z87891 Personal history of nicotine dependence: Secondary | ICD-10-CM | POA: Diagnosis not present

## 2017-01-25 DIAGNOSIS — Z7984 Long term (current) use of oral hypoglycemic drugs: Secondary | ICD-10-CM | POA: Insufficient documentation

## 2017-01-25 DIAGNOSIS — I2510 Atherosclerotic heart disease of native coronary artery without angina pectoris: Secondary | ICD-10-CM | POA: Diagnosis not present

## 2017-01-25 DIAGNOSIS — E1165 Type 2 diabetes mellitus with hyperglycemia: Secondary | ICD-10-CM | POA: Diagnosis not present

## 2017-01-25 DIAGNOSIS — R739 Hyperglycemia, unspecified: Secondary | ICD-10-CM | POA: Diagnosis present

## 2017-01-25 DIAGNOSIS — I252 Old myocardial infarction: Secondary | ICD-10-CM | POA: Insufficient documentation

## 2017-01-25 DIAGNOSIS — Z7982 Long term (current) use of aspirin: Secondary | ICD-10-CM | POA: Diagnosis not present

## 2017-01-25 DIAGNOSIS — I10 Essential (primary) hypertension: Secondary | ICD-10-CM | POA: Diagnosis not present

## 2017-01-25 LAB — URINALYSIS, ROUTINE W REFLEX MICROSCOPIC
BILIRUBIN URINE: NEGATIVE
Glucose, UA: >=500 mg/dL — AB
Hgb urine dipstick: NEGATIVE
Ketones, ur: NEGATIVE mg/dL
LEUKOCYTES UA: NEGATIVE
NITRITE: NEGATIVE
PH: 5 (ref 5.0–8.0)
Protein, ur: NEGATIVE mg/dL
SPECIFIC GRAVITY, URINE: 1.035 — AB (ref 1.005–1.030)

## 2017-01-25 LAB — BASIC METABOLIC PANEL
ANION GAP: 12 (ref 5–15)
BUN: 15 mg/dL (ref 6–20)
CHLORIDE: 94 mmol/L — AB (ref 101–111)
CO2: 26 mmol/L (ref 22–32)
CREATININE: 1.1 mg/dL (ref 0.61–1.24)
Calcium: 9.6 mg/dL (ref 8.9–10.3)
GFR calc non Af Amer: >60 mL/min (ref >60)
Glucose, Bld: 483 mg/dL — ABNORMAL HIGH (ref 65–99)
POTASSIUM: 3.5 mmol/L (ref 3.5–5.1)
SODIUM: 132 mmol/L — AB (ref 135–145)

## 2017-01-25 LAB — URINALYSIS, MICROSCOPIC (REFLEX)
RBC / HPF: NONE SEEN RBC/hpf (ref 0–5)
WBC UA: NONE SEEN WBC/hpf (ref 0–5)

## 2017-01-25 LAB — CBC
HEMATOCRIT: 41.5 % (ref 39.0–52.0)
HEMOGLOBIN: 14.4 g/dL (ref 13.0–17.0)
MCH: 28.7 pg (ref 26.0–34.0)
MCHC: 34.7 g/dL (ref 30.0–36.0)
MCV: 82.7 fL (ref 78.0–100.0)
Platelets: 330 10*3/uL (ref 150–400)
RBC: 5.02 MIL/uL (ref 4.22–5.81)
RDW: 12.4 % (ref 11.5–15.5)
WBC: 8.2 10*3/uL (ref 4.0–10.5)

## 2017-01-25 LAB — CBG MONITORING, ED
GLUCOSE-CAPILLARY: 343 mg/dL — AB (ref 65–99)
GLUCOSE-CAPILLARY: 315 mg/dL — AB (ref 65–99)
Glucose-Capillary: 497 mg/dL — ABNORMAL HIGH (ref 65–99)

## 2017-01-25 MED ORDER — INSULIN ASPART 100 UNIT/ML ~~LOC~~ SOLN
5.0000 [IU] | Freq: Once | SUBCUTANEOUS | Status: AC
  Administered 2017-01-25: 5 [IU] via SUBCUTANEOUS
  Filled 2017-01-25: qty 1

## 2017-01-25 MED ORDER — SODIUM CHLORIDE 0.9 % IV BOLUS (SEPSIS)
1000.0000 mL | Freq: Once | INTRAVENOUS | Status: AC
  Administered 2017-01-25: 1000 mL via INTRAVENOUS

## 2017-01-25 MED ORDER — METFORMIN HCL 500 MG PO TABS: ORAL_TABLET | ORAL | 0 refills | Status: AC

## 2017-01-25 NOTE — ED Notes (Signed)
Follow up CBG was taken after 1750 of the 2073ml NS had infused

## 2017-01-25 NOTE — Discharge Instructions (Signed)
Please read and follow all provided instructions.  Your diagnoses today include:  1. Hyperglycemia     Tests performed today include: Vital signs. See below for your results today.   Medications prescribed:  Take as prescribed  Take Metformin 1000mg  in the morning and 500mg  at night  Home care instructions:  Follow any educational materials contained in this packet.  Follow-up instructions: Please follow-up with your primary care provider for further evaluation of symptoms and treatment   Return instructions:  Please return to the Emergency Department if you do not get better, if you get worse, or new symptoms OR  - Fever (temperature greater than 101.73F)  - Bleeding that does not stop with holding pressure to the area    -Severe pain (please note that you may be more sore the day after your accident)  - Chest Pain  - Difficulty breathing  - Severe nausea or vomiting  - Inability to tolerate food and liquids  - Passing out  - Skin becoming red around your wounds  - Change in mental status (confusion or lethargy)  - New numbness or weakness    Please return if you have any other emergent concerns.  Additional Information:  Your vital signs today were: BP 135/76    Pulse (!) 56    Temp 98 F (36.7 C) (Oral)    Resp 18    Ht 6' (1.829 m)    Wt 110.7 kg (244 lb)    SpO2 98%    BMI 33.09 kg/m  If your blood pressure (BP) was elevated above 135/85 this visit, please have this repeated by your doctor within one month. ---------------

## 2017-01-25 NOTE — ED Notes (Signed)
Pt ambulatory unassisted, in NAD.

## 2017-01-25 NOTE — ED Triage Notes (Addendum)
Patient reports hyperglycemia at home.  Reports taking medication as prescribed.  Currently takes metformin.  Reports polyuria, polydipsia.  Denies nausea, vomiting.  Reports >500 at urgent care and referred to ER.  States he was placed on metformin x 2 months ago.  States blood glucose was elevated at that visit and he is scheduled to follow up this week.

## 2017-01-25 NOTE — ED Notes (Signed)
Pt ambulates to the bathroom to void.  

## 2017-01-25 NOTE — ED Provider Notes (Signed)
[facility] Provider Note   CSN: [id number] Arrival date & time: 01/25/17  1640     History   Chief Complaint Chief Complaint  Patient presents with  . Hyperglycemia    HPI Kyle Arroyo is a 66 y.o. male.  HPI  67 y.o. male with a hx of CAD, DM2, HTN, HLD, Obesity, presents to the Emergency Department today due to hyperglycemia. Sent by UC due to blood sugar >500. Notes Polyuria and Polydipsia. No N/V. No abdominal pain. Notes being on metformin x 2 months and taking medications as prescribed. Denies cough or URI symptoms. No CP/SOB/ABD pain. No fevers. Notes some compliance with diet and exercise modification since being placed on Metformin. No other symptoms noted.   Past Medical History:  Diagnosis Date  . Abnormal stress test 06/20/2011  . Acute coronary syndrome ([place]) 2011  . Anemia 06/20/2011  . Bright red rectal bleeding   . CAD (coronary artery disease) , stents to LCX & distal RCA in acute setting 07/2009 06/20/2011  . Coronary artery disease   . Diabetes mellitus    type 2  . Hyperlipidemia   . Hypertension   . Microcytic anemia 2012/13  . Myocardial infarction ([place])   . Obesity (BMI 30-39.9)   . Shortness of breath     Patient Active Problem List   Diagnosis Date Noted  . Essential hypertension 02/28/2014  . Hyperlipidemia 02/28/2014  . CAD (coronary artery disease) , stents to LCX & distal RCA in acute setting 07/2009, now nonobstructive ISR will treat medically 06/20/2011  . Anemia, transfusing 2 units PRBCs. 06/20/2011  . Abnormal stress test 06/20/2011  . Rectal bleeding 06/19/2011  . Microcytic anemia 06/19/2011  . Obesity (BMI 30-39.9) 06/19/2011  . Type 2 diabetes mellitus ([place]) 06/19/2011    Past Surgical History:  Procedure Laterality Date  . CORONARY ANGIOPLASTY  06/2011  . EXCISIONAL HEMORRHOIDECTOMY  1980's  . LEFT HEART CATHETERIZATION WITH CORONARY ANGIOGRAM N/A 06/19/2011   Procedure: LEFT HEART CATHETERIZATION WITH CORONARY  ANGIOGRAM;  Surgeon: Lorretta Harp, MD;  Location: [facility] CATH LAB;  Service: Cardiovascular;  Laterality: N/A;  . srabismus surgery     childhood  . TESTICLE REMOVAL  2007   benign testicle mass, right       Home Medications    Prior to Admission medications   Medication Sig Start Date End Date Taking? Authorizing Provider  metFORMIN (GLUCOPHAGE) 500 MG tablet Take by mouth 2 (two) times daily with a meal.   Yes [provider]  amLODipine (NORVASC) 5 MG tablet TAKE 1 TABLET BY MOUTH  DAILY 08/04/16   Lorretta Harp, MD  aspirin 325 MG tablet Take 325 mg by mouth daily.    [provider]  EFFIENT 10 MG TABS tablet TAKE 1 TABLET BY MOUTH  DAILY 08/08/16   Lorretta Harp, MD  ezetimibe (ZETIA) 10 MG tablet Take 1 tablet (10 mg total) by mouth daily. 02/28/14   Lorretta Harp, MD  lisinopril-hydrochlorothiazide (PRINZIDE,ZESTORETIC) 10-12.5 MG tablet TAKE 1 TABLET BY MOUTH TWO  TIMES DAILY 07/21/16   Lorretta Harp, MD  nebivolol (BYSTOLIC) 10 MG tablet Take 1 tablet (10 mg total) by mouth daily. 02/28/14   Lorretta Harp, MD  nitroGLYCERIN (NITROSTAT) 0.4 MG SL tablet Place 1 tablet (0.4 mg total) under the tongue every 5 (five) minutes as needed for chest pain. 06/20/11 06/19/12  Isaiah Serge, NP  Omega 3 1000 MG CAPS Take 1 capsule by mouth daily.  [provider]  OVER THE COUNTER MEDICATION Take 1 tablet by mouth daily. Co-q-10    [provider]  rosuvastatin (CRESTOR) 20 MG tablet TAKE 1 TABLET BY MOUTH  DAILY 07/21/16   Lorretta Harp, MD    Family History Family History  Problem Relation Age of Onset  . Colon cancer Neg Hx     Social History Social History  Substance Use Topics  . Smoking status: Former Smoker    Quit date: 06/25/2008  . Smokeless tobacco: Never Used     Comment: quit in 2010  . Alcohol use Yes     Comment: occasional     Allergies   Levofloxacin   Review of Systems Review of Systems ROS  reviewed and all are negative for acute change except as noted in the HPI.  Physical Exam Updated Vital Signs BP (!) 155/95 (BP Location: Left Arm)   Pulse 68   Temp 98 F (36.7 C) (Oral)   Resp 18   Ht 6' (1.829 m)   Wt 110.7 kg (244 lb)   SpO2 98%   BMI 33.09 kg/m   Physical Exam  Constitutional: He is oriented to person, place, and time. Vital signs are normal. He appears well-developed and well-nourished. No distress.  HENT:  Head: Normocephalic and atraumatic.  Right Ear: Hearing, tympanic membrane, external ear and ear canal normal.  Left Ear: Hearing, tympanic membrane, external ear and ear canal normal.  Nose: Nose normal.  Mouth/Throat: Uvula is midline, oropharynx is clear and moist and mucous membranes are normal. No trismus in the jaw. No oropharyngeal exudate, posterior oropharyngeal erythema or tonsillar abscesses.  Eyes: Pupils are equal, round, and reactive to light. Conjunctivae and EOM are normal.  Neck: Normal range of motion. Neck supple. No tracheal deviation present.  Cardiovascular: Normal rate, regular rhythm, S1 normal, S2 normal, normal heart sounds, intact distal pulses and normal pulses.   Pulmonary/Chest: Effort normal and breath sounds normal. No respiratory distress. He has no decreased breath sounds. He has no wheezes. He has no rhonchi. He has no rales.  Abdominal: Normal appearance and bowel sounds are normal. There is no tenderness.  Musculoskeletal: Normal range of motion.  Neurological: He is alert and oriented to person, place, and time.  Skin: Skin is warm and dry.  Psychiatric: He has a normal mood and affect. His speech is normal and behavior is normal. Thought content normal.  Nursing note and vitals reviewed.    ED Treatments / Results  Labs (all labs ordered are listed, but only abnormal results are displayed) Labs Reviewed  BASIC METABOLIC PANEL - Abnormal; Notable for the following:       Result Value   Sodium 132 (*)     Chloride 94 (*)    Glucose, Bld 483 (*)    All other components within normal limits  URINALYSIS, ROUTINE W REFLEX MICROSCOPIC - Abnormal; Notable for the following:    Specific Gravity, Urine 1.035 (*)    Glucose, UA >=500 (*)    All other components within normal limits  URINALYSIS, MICROSCOPIC (REFLEX) - Abnormal; Notable for the following:    Bacteria, UA RARE (*)    Squamous Epithelial / LPF 0-5 (*)    All other components within normal limits  CBG MONITORING, ED - Abnormal; Notable for the following:    Glucose-Capillary 497 (*)    All other components within normal limits  CBG MONITORING, ED - Abnormal; Notable for the following:    Glucose-Capillary 343 (*)  All other components within normal limits  CBC    EKG  EKG Interpretation None       Radiology No results found.  Procedures Procedures (including critical care time)  Medications Ordered in ED Medications  insulin aspart (novoLOG) injection 5 Units (not administered)  sodium chloride 0.9 % bolus 1,000 mL (0 mLs Intravenous Stopped 01/25/17 1831)  sodium chloride 0.9 % bolus 1,000 mL (0 mLs Intravenous Stopped 01/25/17 1831)     Initial Impression / Assessment and Plan / ED Course  I have reviewed the triage vital signs and the nursing notes.  Pertinent labs & imaging results that were available during my care of the patient were reviewed by me and considered in my medical decision making (see chart for details).  Final Clinical Impressions(s) / ED Diagnoses  {I have reviewed and evaluated the relevant laboratory values.   {I have reviewed the relevant previous healthcare records.  {I obtained HPI from historian.   ED Course:  Assessment: Pt is a 66 y.o. male with a hx of CAD, DM2, HTN, HLD, Obesity, presents to the Emergency Department today due to hyperglycemia. Sent by UC due to blood sugar >500. Notes Polyuria and Polydipsia. No N/V. No abdominal pain. Notes being on metformin x 2 months and taking  medications as prescribed. Denies cough or URI symptoms. No CP/SOB/ABD pain. No fevers. Notes some compliance with diet and exercise modification since being placed on Metformin.. On exam, pt in NAD. Nontoxic/nonseptic appearing. VSS. Afebrile. Lungs CTA. Heart RRR. Abdomen nontender soft. POC Glucose 497. CBC unremarkable. Potassium WNL. Gap 12. UA without ketones. Given NS bolus 2L in ED as well as 5U Insulin. Plan is to DC home with follow up to PCP. Increased dose of Metformin to 1500mg  per day. At time of discharge, Patient is in no acute distress. Vital Signs are stable. Patient is able to ambulate. Patient able to tolerate PO.   Disposition/Plan:  DC Home Additional Verbal discharge instructions given and discussed with patient.  Pt Instructed to f/u with PCP in the next week for evaluation and treatment of symptoms. Return precautions given Pt acknowledges and agrees with plan  Supervising Physician Little, Wenda Overland, MD  Final diagnoses:  Hyperglycemia    New Prescriptions New Prescriptions   No medications on file     Shary Decamp, Hershal Coria 01/25/17 2014    Little, Wenda Overland, MD 01/26/17 [phone]

## 2017-03-17 ENCOUNTER — Other Ambulatory Visit: Payer: Self-pay | Admitting: Cardiovascular Disease

## 2017-07-03 ENCOUNTER — Other Ambulatory Visit: Payer: Self-pay | Admitting: Cardiovascular Disease

## 2017-08-06 ENCOUNTER — Telehealth: Payer: Self-pay | Admitting: Cardiovascular Disease

## 2017-08-06 NOTE — Telephone Encounter (Signed)
Closed Encounter  °

## 2017-08-07 ENCOUNTER — Ambulatory Visit: Payer: 59 | Admitting: Cardiovascular Disease

## 2017-08-28 ENCOUNTER — Ambulatory Visit (INDEPENDENT_AMBULATORY_CARE_PROVIDER_SITE_OTHER): Payer: 59 | Admitting: Cardiovascular Disease

## 2017-08-28 ENCOUNTER — Encounter: Payer: Self-pay | Admitting: Cardiovascular Disease

## 2017-08-28 VITALS — BP 150/80 | HR 63 | Ht 72.0 in | Wt 263.0 lb

## 2017-08-28 DIAGNOSIS — I2510 Atherosclerotic heart disease of native coronary artery without angina pectoris: Secondary | ICD-10-CM

## 2017-08-28 DIAGNOSIS — E7800 Pure hypercholesterolemia, unspecified: Secondary | ICD-10-CM | POA: Diagnosis not present

## 2017-08-28 DIAGNOSIS — I10 Essential (primary) hypertension: Secondary | ICD-10-CM | POA: Diagnosis not present

## 2017-08-28 NOTE — Assessment & Plan Note (Signed)
History of CAD status post stenting of the circumflex by myself February 2011 in the setting of acute coronary syndrome. He was recatheterized 10/26/09 by Dr. Claiborne Billings with high-grade in-stent restenosis within the distal RCA stent and he underwent cutting balloon angioplasty with excellent result. His last catheterization was by myself 06/19/11 revealing moderate disease of the circumflex. I performed FFR which was negative. He has done well since and denies chest pain or shortness of breath.

## 2017-08-28 NOTE — Assessment & Plan Note (Signed)
History of hyperlipidemia on statin therapy. We will recheck a lipid and liver profile 

## 2017-08-28 NOTE — Assessment & Plan Note (Signed)
History of essential hypertension blood pressure measured at 150/80. He is on amlodipine, lisinopril, hydrochlorothiazide and Bystolic  Continue current meds at current dosing.

## 2017-08-28 NOTE — Progress Notes (Signed)
08/28/2017 Kyle Arroyo   July 03, 1950  [id number]  Primary Physician Shirline Frees, MD Primary Cardiologist: Lorretta Harp MD FACP, [place], [place], [place]  HPI:  Kyle Arroyo is a 67 y.o.  moderately overweight married Caucasian male, father of 4, grandfather to one grandchild who I last saw in the office 07/25/16.He  has a history of CAD status post stenting of his proximal circumflex and distal right coronary artery in the setting of acute coronary syndrome in February of 2011.I last saw him in the office 02/28/14. His other problems include discontinue tobacco abuse at that time, hypertension and hyperlipidemia. He was recatheterized by Dr. Claiborne Billings 5/27/11n the setting of acute coronary syndrome with documented patent circumflex stent and high-grade in-stent restenosis and put within the distal right coronary artery stent. Dr. Claiborne Billings performed cutting balloon angioplasty with an excellent result. He has been complaining of increasing dyspnea on exertion or lower extremity edema for the prior 2 weeks and was aware of salt restriction. I obtained a 2-D echo which showed essentially normal bili function and a Myoview stress test that showed normal perfusion images. However, on exercise he did have 2-3 mm of ST segment depression. In addition, his preprocedure labs were remarkable for hemoglobin of 8.1 which was a new finding. He was transfused resulted in marked improvement in his symptoms.Because of the symptoms of his abnormal electrocardiographic response to exercise he underwent cardiac catheterization by myself 06/19/11 revealed patent stents with moderate disease in the circumflex. I performed FFR on this revealing it to be not physiologically significant and medical therapy was recommended. Since I saw him back 07/25/16,he has remained stable without chest pain or shortness of breath.     Current Meds  Medication Sig  . amLODipine (NORVASC) 5 MG tablet TAKE 1 TABLET BY MOUTH  DAILY  . aspirin  325 MG tablet Take 325 mg by mouth daily.  Marland Kitchen lisinopril-hydrochlorothiazide (PRINZIDE,ZESTORETIC) 10-12.5 MG tablet TAKE 1 TABLET BY MOUTH TWO  TIMES DAILY  . metFORMIN (GLUCOPHAGE) 500 MG tablet Take 2 tablets (1000mg ) in the morning and 1 tablet (500mg ) at night  . nebivolol (BYSTOLIC) 10 MG tablet Take 1 tablet (10 mg total) by mouth daily.  . Omega 3 1000 MG CAPS Take 1 capsule by mouth daily.  Marland Kitchen OVER THE COUNTER MEDICATION Take 1 tablet by mouth daily. Co-q-10  . prasugrel (EFFIENT) 10 MG TABS tablet TAKE 1 TABLET BY MOUTH  DAILY  . rosuvastatin (CRESTOR) 20 MG tablet TAKE 1 TABLET BY MOUTH  DAILY     Allergies  Allergen Reactions  . Levofloxacin Shortness Of Breath and Swelling    Severe swelling of legs and testicles    Social History   Socioeconomic History  . Marital status: Married    Spouse name: Not on file  . Number of children: Not on file  . Years of education: Not on file  . Highest education level: Not on file  Occupational History  . Not on file  Social Needs  . Financial resource strain: Not on file  . Food insecurity:    Worry: Not on file    Inability: Not on file  . Transportation needs:    Medical: Not on file    Non-medical: Not on file  Tobacco Use  . Smoking status: Former Smoker    Last attempt to quit: 06/25/2008    Years since quitting: 9.1  . Smokeless tobacco: Never Used  . Tobacco comment: quit in 2010  Substance and  Sexual Activity  . Alcohol use: Yes    Comment: occasional  . Drug use: No  . Sexual activity: Yes  Lifestyle  . Physical activity:    Days per week: Not on file    Minutes per session: Not on file  . Stress: Not on file  Relationships  . Social connections:    Talks on phone: Not on file    Gets together: Not on file    Attends religious service: Not on file    Active member of club or organization: Not on file    Attends meetings of clubs or organizations: Not on file    Relationship status: Not on file  .  Intimate partner violence:    Fear of current or ex partner: Not on file    Emotionally abused: Not on file    Physically abused: Not on file    Forced sexual activity: Not on file  Other Topics Concern  . Not on file  Social History Narrative  . Not on file     Review of Systems: General: negative for chills, fever, night sweats or weight changes.  Cardiovascular: negative for chest pain, dyspnea on exertion, edema, orthopnea, palpitations, paroxysmal nocturnal dyspnea or shortness of breath Dermatological: negative for rash Respiratory: negative for cough or wheezing Urologic: negative for hematuria Abdominal: negative for nausea, vomiting, diarrhea, bright red blood per rectum, melena, or hematemesis Neurologic: negative for visual changes, syncope, or dizziness All other systems reviewed and are otherwise negative except as noted above.    Blood pressure (!) 150/80, pulse 63, height 6' (1.829 m), weight 263 lb (119.3 kg).  General appearance: alert and no distress Neck: no adenopathy, no carotid bruit, no JVD, supple, symmetrical, trachea midline and thyroid not enlarged, symmetric, no tenderness/mass/nodules Lungs: clear to auscultation bilaterally Heart: regular rate and rhythm, S1, S2 normal, no murmur, click, rub or gallop Extremities: extremities normal, atraumatic, no cyanosis or edema Pulses: 2+ and symmetric Skin: Skin color, texture, turgor normal. No rashes or lesions Neurologic: Alert and oriented X 3, normal strength and tone. Normal symmetric reflexes. Normal coordination and gait  EKG normal sinus rhythm at 63 with nonspecific ST and T-wave changes. I personally reviewed this EKG.  ASSESSMENT AND PLAN:   CAD (coronary artery disease) , stents to LCX & distal RCA in acute setting 07/2009, now nonobstructive ISR will treat medically History of CAD status post stenting of the circumflex by myself February 2011 in the setting of acute coronary syndrome. He was  recatheterized 10/26/09 by Dr. Claiborne Billings with high-grade in-stent restenosis within the distal RCA stent and he underwent cutting balloon angioplasty with excellent result. His last catheterization was by myself 06/19/11 revealing moderate disease of the circumflex. I performed FFR which was negative. He has done well since and denies chest pain or shortness of breath.  Essential hypertension History of essential hypertension blood pressure measured at 150/80. He is on amlodipine, lisinopril, hydrochlorothiazide and Bystolic  Continue current meds at current dosing.  Hyperlipidemia History of hyperlipidemia on statin therapy. We will recheck a lipid and liver profile.      Lorretta Harp MD FACP,FACC,FAHA, [facility] 08/28/2017 9:18 AM

## 2017-08-28 NOTE — Patient Instructions (Signed)

## 2017-09-02 ENCOUNTER — Other Ambulatory Visit: Payer: Self-pay | Admitting: Cardiovascular Disease

## 2017-12-30 ENCOUNTER — Ambulatory Visit (INDEPENDENT_AMBULATORY_CARE_PROVIDER_SITE_OTHER): Payer: 59 | Admitting: Neurology

## 2017-12-30 ENCOUNTER — Encounter: Payer: Self-pay | Admitting: Neurology

## 2017-12-30 DIAGNOSIS — G603 Idiopathic progressive neuropathy: Secondary | ICD-10-CM

## 2017-12-30 DIAGNOSIS — G629 Polyneuropathy, unspecified: Secondary | ICD-10-CM | POA: Insufficient documentation

## 2017-12-30 HISTORY — DX: Polyneuropathy, unspecified: G62.9

## 2017-12-30 NOTE — Progress Notes (Signed)
Please refer to EMG and nerve conduction study procedure note. 

## 2017-12-30 NOTE — Progress Notes (Signed)
[place]    Nerve / Sites Muscle Latency Ref. Amplitude Ref. Rel Amp Segments Distance Velocity Ref. Area    ms ms mV mV %  cm m/s m/s mVms  L Peroneal - EDB     Ankle EDB 5.0 ?6.5 2.1 ?2.0 100 Ankle - EDB 9   7.0     Fib head EDB 13.5  1.4  65.8 Fib head - Ankle 34 40 ?44 4.8     Pop fossa EDB 15.9  1.5  105 Pop fossa - Fib head 10 41 ?44 5.1         Pop fossa - Ankle      R Peroneal - EDB     Ankle EDB 5.3 ?6.5 1.2 ?2.0 100 Ankle - EDB 9   2.9     Fib head EDB 14.9  1.3  102 Fib head - Ankle 34 35 ?44 3.0     Pop fossa EDB 17.7  1.1  83.9 Pop fossa - Fib head 10 37 ?44 2.6         Pop fossa - Ankle      L Tibial - AH     Ankle AH 5.3 ?5.8 2.8 ?4.0 100 Ankle - AH 9   4.7     Pop fossa AH 17.8  1.4  49.7 Pop fossa - Ankle 40 32 ?41 3.1  R Tibial - AH     Ankle AH 5.5 ?5.8 3.1 ?4.0 100 Ankle - AH 9   6.9     Pop fossa AH 17.7  1.6  52.9 Pop fossa - Ankle 40 33 ?41 5.2             SNC    Nerve / Sites Rec. Site Peak Lat Ref.  Amp Ref. Segments Distance    ms ms V V  cm  L Sural - Ankle (Calf)     Calf Ankle NR ?4.4 NR ?6 Calf - Ankle 14  R Sural - Ankle (Calf)     Calf Ankle NR ?4.4 NR ?6 Calf - Ankle 14  R Superficial peroneal - Ankle     Lat leg Ankle NR ?4.4 NR ?6 Lat leg - Ankle 14  L Superficial peroneal - Ankle     Lat leg Ankle NR ?4.4 NR ?6 Lat leg - Ankle 14                   F  Wave    Nerve F Lat Ref.   ms ms  L Tibial - AH 66.4 ?56.0  R Tibial - AH 64.8 ?56.0

## 2017-12-30 NOTE — Procedures (Signed)
     HISTORY:  Kyle Arroyo is a 67 year old patient with a history of paresthesias involving the feet at nighttime that has been present for about 7 months.  The patient has difficulty sleeping because of this.  He does report some left leg pain and low back pain as well.  The patient is being evaluated for a possible peripheral neuropathy or a lumbar radiculopathy.  NERVE CONDUCTION STUDIES:  Nerve conduction studies were performed on both lower extremities.  The distal motor latencies for the peroneal and posterior tibial nerves were within normal limits bilaterally with low motor amplitudes seen for these nerves bilaterally.  Slowing was seen for the peroneal and posterior tibial nerves bilaterally with absence of the sural and peroneal sensory latencies bilaterally.  The F-wave latencies for the posterior tibial nerves were prolonged bilaterally.  EMG STUDIES:  EMG study was performed on the left lower extremity:  The tibialis anterior muscle reveals 2 to 4K motor units with full recruitment. No fibrillations or positive waves were seen. The peroneus tertius muscle reveals 2 to 4K motor units with slightly reduced recruitment. No fibrillations or positive waves were seen. The medial gastrocnemius muscle reveals 1 to 3K motor units with full recruitment. No fibrillations or positive waves were seen. The vastus lateralis muscle reveals 2 to 4K motor units with full recruitment. No fibrillations or positive waves were seen. The iliopsoas muscle reveals 2 to 4K motor units with full recruitment. No fibrillations or positive waves were seen. The biceps femoris muscle (long head) reveals 2 to 4K motor units with full recruitment. No fibrillations or positive waves were seen. The lumbosacral paraspinal muscles were tested at 3 levels, and revealed no abnormalities of insertional activity at all 3 levels tested. There was good relaxation.   IMPRESSION:  Nerve conduction studies done on both  lower extremities shows evidence of a primarily axonal peripheral neuropathy of moderate severity.  EMG evaluation of the left lower extremity shows minimal distal signs of chronic denervation consistent with a peripheral neuropathy, no evidence of an overlying lumbosacral radiculopathy was seen.  Jill Alexanders MD 12/30/2017 4:24 PM  [facility] [address] [place], [place] [address]  Phone [phone] Fax [phone]

## 2018-05-23 ENCOUNTER — Other Ambulatory Visit: Payer: Self-pay | Admitting: Cardiovascular Disease

## 2018-06-16 ENCOUNTER — Other Ambulatory Visit: Payer: Self-pay | Admitting: Cardiovascular Disease

## 2018-08-30 ENCOUNTER — Telehealth: Payer: Self-pay

## 2018-08-30 ENCOUNTER — Other Ambulatory Visit: Payer: Self-pay

## 2018-08-30 MED ORDER — ROSUVASTATIN CALCIUM 20 MG PO TABS: 20.0000 mg | ORAL_TABLET | Freq: Every day | ORAL | 3 refills | Status: DC

## 2018-08-30 MED ORDER — PRASUGREL HCL 10 MG PO TABS: 10.0000 mg | ORAL_TABLET | Freq: Every day | ORAL | 3 refills | Status: DC

## 2018-08-30 MED ORDER — AMLODIPINE BESYLATE 5 MG PO TABS: 5.0000 mg | ORAL_TABLET | Freq: Every day | ORAL | 3 refills | Status: DC

## 2018-08-30 NOTE — Telephone Encounter (Signed)
Called and spoke with patient. He is not having any new or worsening symptoms. Explained to the patient that due to the COVID-19 and him not having any new or worsening symptoms that by the suggestion of his Cardiologist that he follow up with an APP in 3 months and with is MD in 6 months. Patient is ok with the 3 and 6 months follow up appointments. It was also explained to the patient that if he starts to develop new or worsening symptoms to call our office or go to the nearest ER. Patient verbalized an understanding.

## 2018-08-31 ENCOUNTER — Ambulatory Visit: Payer: 59 | Admitting: Cardiovascular Disease

## 2018-09-23 ENCOUNTER — Other Ambulatory Visit: Payer: Self-pay | Admitting: Orthopaedic Surgery

## 2018-09-23 DIAGNOSIS — M4716 Other spondylosis with myelopathy, lumbar region: Secondary | ICD-10-CM

## 2018-10-11 ENCOUNTER — Telehealth: Payer: Self-pay

## 2018-10-11 NOTE — Telephone Encounter (Signed)
I contacted the pt in regards to his 10/13/18 appt with Dr. Jannifer Franklin. Trying to verify if pt could do a video visit.

## 2018-10-12 ENCOUNTER — Telehealth: Payer: Self-pay | Admitting: Neurology

## 2018-10-12 NOTE — Telephone Encounter (Signed)
Due to current COVID 19 pandemic, our office is severely reducing in office visits until further notice, in order to minimize the risk to our patients and healthcare providers.   Called patient and LVM to offer virtual visit for his 5/13 appointment. Patient returned my call and declined a virtual or telephone visit. I then offered him the option to come in office if needed. Patient declined this as well and stated that he is not having the symptoms that he used to and does not feel the need to keep the appointment. I advised patient that I would cancel appt and to call back if necessary.

## 2018-10-13 ENCOUNTER — Institutional Professional Consult (permissible substitution): Payer: 59 | Admitting: Neurology

## 2018-11-09 ENCOUNTER — Ambulatory Visit
Admission: RE | Admit: 2018-11-09 | Discharge: 2018-11-09 | Disposition: A | Discharge status: Home / Self Care | Payer: 59 | Source: Ambulatory Visit | Attending: Orthopaedic Surgery | Admitting: Orthopaedic Surgery

## 2018-11-09 ENCOUNTER — Other Ambulatory Visit: Payer: Self-pay

## 2018-11-09 DIAGNOSIS — M4716 Other spondylosis with myelopathy, lumbar region: Secondary | ICD-10-CM

## 2019-01-03 ENCOUNTER — Encounter: Payer: Self-pay | Admitting: Orthopedic Surgery

## 2019-01-03 ENCOUNTER — Ambulatory Visit: Payer: Self-pay

## 2019-01-03 ENCOUNTER — Ambulatory Visit (INDEPENDENT_AMBULATORY_CARE_PROVIDER_SITE_OTHER): Payer: 59 | Admitting: Orthopedic Surgery

## 2019-01-03 ENCOUNTER — Other Ambulatory Visit: Payer: Self-pay

## 2019-01-03 DIAGNOSIS — M5412 Radiculopathy, cervical region: Secondary | ICD-10-CM | POA: Diagnosis not present

## 2019-01-03 DIAGNOSIS — M25522 Pain in left elbow: Secondary | ICD-10-CM

## 2019-01-03 NOTE — Progress Notes (Signed)
Office Visit Note   Patient: Kyle Arroyo           Date of Birth: [birth year]           MRN: [id number] Visit Date: 01/03/2019 Requested by: Shirline Frees, MD [place],  [place] [address] PCP: Shirline Frees, MD  Subjective: Chief Complaint  Patient presents with  . Left Elbow - Pain  . radiculopathy    HPI: Kyle Arroyo is a 68 y.o. male who presents to the office complaining of left elbow pain.  Patient states he has been experiencing left elbow and forearm pain over the past 2 months.  He initially had symptoms for 2 weeks that subsequently resolved on its own for 2 weeks and now for the past 3 to 4 weeks he has had the same symptoms.  He notes pain that he localizes to the dorsal proximal forearm.  This is associated with numbness and tingling that goes into the dorsal thumb and index finger of the left hand.  Patient states this pain keeps him up at night.  He does not associate this with any injury.  He notes neck pain.  He is right-hand dominant.  Patient has a history of a prior distal bicep tendon repair on his left.  He has a history of sciatica for which she has had a nerve conduction study he has never had a nerve conduction study of his upper extremities.              ROS:  All systems reviewed are negative as they relate to the chief complaint within the history of present illness.  Patient denies fevers or chills.  Assessment & Plan: Visit Diagnoses:  1. Radiculopathy, cervical region   2. Pain in left elbow     Plan: Patient is a 68 year old male who presents with left forearm pain and numbness and tingling of the dorsal thumb and index finger.  Findings on exam as well as history are suspicious for posterior interosseous nerve entrapment versus bulging cervical disc.  The PIN may be irritated by patient's prior left elbow bicep tendon repair that was 5 to 6 years ago, however he denies any symptoms following surgery.  Recommended that we  proceed with a nerve conduction study; patient agreed with this plan.  He will follow-up afterward to review results.  Follow-Up Instructions: No follow-ups on file.   Orders:  Orders Placed This Encounter  Procedures  . XR Cervical Spine 2 or 3 views  . XR Elbow 2 Views Left   No orders of the defined types were placed in this encounter.     Procedures: No procedures performed   Clinical Data: No additional findings.  Objective: Vital Signs: There were no vitals taken for this visit.  Physical Exam:  Constitutional: Patient appears well-developed HEENT:  Head: Normocephalic Eyes:EOM are normal Neck: Normal range of motion Cardiovascular: Normal rate Pulmonary/chest: Effort normal Neurologic: Patient is alert Skin: Skin is warm Psychiatric: Patient has normal mood and affect  Ortho Exam:  Left upper extremity exam Nontender over the medial and lateral epicondyles Reduced sensation of the dorsal thumb and index finger when compared to the contralateral side Negative Tinel sign, negative Durkin sign 5 out of 5 grip strength, finger abduction, forearm pronation/supination, biceps, triceps, deltoid Negative elbow flexion test  Specialty Comments:  No specialty comments available.  Imaging: No results found.   PMFS History: Patient Active Problem List   Diagnosis Date Noted  .  Peripheral neuropathy 12/30/2017  . Essential hypertension 02/28/2014  . Hyperlipidemia 02/28/2014  . CAD (coronary artery disease) , stents to LCX & distal RCA in acute setting 07/2009, now nonobstructive ISR will treat medically 06/20/2011  . Anemia, transfusing 2 units PRBCs. 06/20/2011  . Abnormal stress test 06/20/2011  . Rectal bleeding 06/19/2011  . Microcytic anemia 06/19/2011  . Obesity (BMI 30-39.9) 06/19/2011  . Type 2 diabetes mellitus ([place]) 06/19/2011   Past Medical History:  Diagnosis Date  . Abnormal stress test 06/20/2011  . Acute coronary syndrome ([place]) 2011  .  Anemia 06/20/2011  . Bright red rectal bleeding   . CAD (coronary artery disease) , stents to LCX & distal RCA in acute setting 07/2009 06/20/2011  . Coronary artery disease   . Diabetes mellitus    type 2  . Hyperlipidemia   . Hypertension   . Microcytic anemia 2012/13  . Myocardial infarction ([place])   . Obesity (BMI 30-39.9)   . Peripheral neuropathy 12/30/2017  . Shortness of breath     Family History  Problem Relation Age of Onset  . Colon cancer Neg Hx     Past Surgical History:  Procedure Laterality Date  . CORONARY ANGIOPLASTY  06/2011  . EXCISIONAL HEMORRHOIDECTOMY  1980's  . LEFT HEART CATHETERIZATION WITH CORONARY ANGIOGRAM N/A 06/19/2011   Procedure: LEFT HEART CATHETERIZATION WITH CORONARY ANGIOGRAM;  Surgeon: Lorretta Harp, MD;  Location: [facility] CATH LAB;  Service: Cardiovascular;  Laterality: N/A;  . srabismus surgery     childhood  . TESTICLE REMOVAL  2007   benign testicle mass, right   Social History   Occupational History  . Not on file  Tobacco Use  . Smoking status: Former Smoker    Quit date: 06/25/2008    Years since quitting: 10.5  . Smokeless tobacco: Never Used  . Tobacco comment: quit in 2010  Substance and Sexual Activity  . Alcohol use: Yes    Comment: occasional  . Drug use: No  . Sexual activity: Yes

## 2019-01-04 ENCOUNTER — Encounter: Payer: Self-pay | Admitting: Cardiovascular Disease

## 2019-01-04 ENCOUNTER — Ambulatory Visit (INDEPENDENT_AMBULATORY_CARE_PROVIDER_SITE_OTHER): Payer: 59 | Admitting: Cardiovascular Disease

## 2019-01-04 VITALS — BP 142/88 | HR 71 | Temp 97.9°F | Ht 72.0 in | Wt 269.0 lb

## 2019-01-04 DIAGNOSIS — E7800 Pure hypercholesterolemia, unspecified: Secondary | ICD-10-CM

## 2019-01-04 DIAGNOSIS — I10 Essential (primary) hypertension: Secondary | ICD-10-CM | POA: Diagnosis not present

## 2019-01-04 NOTE — Assessment & Plan Note (Signed)
History of CAD status post stenting of the proximal circumflex and distal RCA by myself in setting of acute coronary syndrome February 2011.  He was recatheterized 10/26/2009 by Dr. Claiborne Billings in the setting of ACS documented patent circumflex stent and high grade in-stent restenosis within the distal RCA stent which underwent Cutting Balloon atherectomy.  His last cath by myself 06/19/2011 revealed patent stents with moderate distal circumflex disease.  FFR suggested this was not physiologically significant and medical therapy was recommended.  He said no recurrent symptoms.

## 2019-01-04 NOTE — Assessment & Plan Note (Signed)
History of essential hypertension with blood pressure measured today 142/88.  He is on amlodipine, lisinopril, hydrochlorothiazide and Bystolic.

## 2019-01-04 NOTE — Assessment & Plan Note (Signed)
History of hyperlipidemia on Crestor with lipid profile performed 11/09/2017 revealing total cholesterol 170, LDL of 89 and HDL 44.  I am going to check a fasting lipid and liver profile and adjust his medications appropriately

## 2019-01-04 NOTE — Patient Instructions (Addendum)
Medication Instructions:  Your physician recommends that you continue on your current medications as directed. Please refer to the Current Medication list given to you today.  If you need a refill on your cardiac medications before your next appointment, please call your pharmacy.   Lab work: Your physician recommends that you return for lab work within 1 week: [place]  If you have labs (blood work) drawn today and your tests are completely normal, you will receive your results only by: Marland Kitchen MyChart Message (if you have MyChart) OR . A paper copy in the mail If you have any lab test that is abnormal or we need to change your treatment, we will call you to review the results.  Testing/Procedures: NONE  Follow-Up: At [facility], you and your health needs are our priority.  As part of our continuing mission to provide you with exceptional heart care, we have created designated Provider Care Teams.  These Care Teams include your primary Cardiologist (physician) and Advanced Practice Providers (APPs -  Physician Assistants and Nurse Practitioners) who all work together to provide you with the care you need, when you need it. You will need a follow up appointment in 12 months WITH DR. Gwenlyn Found.  Please call our office 2 months in advance to schedule this appointment.

## 2019-01-04 NOTE — Progress Notes (Signed)
01/04/2019 Kyle Arroyo   Nov 18, 1950  [id number]  Primary Physician Shirline Frees, MD Primary Cardiologist: Lorretta Harp MD FACP, [place], [place], [place]  HPI:  Kyle Arroyo is a 68 y.o.  moderately overweight married Caucasian male, father of 82, grandfather to one grandchildwho I last saw in the office  08/28/2017.Hehas a history of CAD status post stenting of his proximal circumflex and distal right coronary artery in the setting of acute coronary syndrome in February of 2011.I last saw him in the office 02/28/14. His other problems include discontinue tobacco abuse at that time, hypertension and hyperlipidemia. He was recatheterized by Dr. Claiborne Billings 5/27/11n the setting of acute coronary syndrome with documented patent circumflex stent and high-grade in-stent restenosis and put within the distal right coronary artery stent. Dr. Claiborne Billings performed cutting balloon angioplasty with an excellent result. He has been complaining of increasing dyspnea on exertion or lower extremity edema for the prior 2 weeks and was aware of salt restriction. I obtained a 2-D echo which showed essentially normal bili function and a Myoview stress test that showed normal perfusion images. However, on exercise he did have 2-3 mm of ST segment depression. In addition, his preprocedure labs were remarkable for hemoglobin of 8.1 which was a new finding. He was transfused resulted in marked improvement in his symptoms.Because of the symptoms of his abnormal electrocardiographic response to exercise he underwent cardiac catheterization by myself 06/19/11 revealed patent stents with moderate disease in the circumflex. I performed FFR on this revealing it to be not physiologically significant and medical therapy was recommended.  Since I saw him over a year ago he is remained stable and denies chest pain or shortness of breath.  He has complained of some numbness in his left arm, left first and second fingers which sounds  neuropathic.   Current Meds  Medication Sig  . amLODipine (NORVASC) 5 MG tablet Take 1 tablet (5 mg total) by mouth daily.  Marland Kitchen aspirin 325 MG tablet Take 325 mg by mouth daily.  Marland Kitchen glimepiride (AMARYL) 4 MG tablet   . lisinopril-hydrochlorothiazide (PRINZIDE,ZESTORETIC) 10-12.5 MG tablet TAKE 1 TABLET BY MOUTH TWO  TIMES DAILY  . metFORMIN (GLUCOPHAGE) 500 MG tablet Take 2 tablets (1000mg ) in the morning and 1 tablet (500mg ) at night  . nebivolol (BYSTOLIC) 10 MG tablet Take 1 tablet (10 mg total) by mouth daily.  . nitroGLYCERIN (NITROSTAT) 0.4 MG SL tablet Place 1 tablet (0.4 mg total) under the tongue every 5 (five) minutes as needed for chest pain.  . Omega 3 1000 MG CAPS Take 1 capsule by mouth daily.  Marland Kitchen OVER THE COUNTER MEDICATION Take 1 tablet by mouth daily. Co-q-10  . prasugrel (EFFIENT) 10 MG TABS tablet Take 1 tablet (10 mg total) by mouth daily.  . rosuvastatin (CRESTOR) 20 MG tablet Take 1 tablet (20 mg total) by mouth daily.     Allergies  Allergen Reactions  . Levofloxacin Shortness Of Breath and Swelling    Severe swelling of legs and testicles    Social History   Socioeconomic History  . Marital status: Married    Spouse name: Not on file  . Number of children: Not on file  . Years of education: Not on file  . Highest education level: Not on file  Occupational History  . Not on file  Social Needs  . Financial resource strain: Not on file  . Food insecurity    Worry: Not on file    Inability: Not  on file  . Transportation needs    Medical: Not on file    Non-medical: Not on file  Tobacco Use  . Smoking status: Former Smoker    Quit date: 06/25/2008    Years since quitting: 10.5  . Smokeless tobacco: Never Used  . Tobacco comment: quit in 2010  Substance and Sexual Activity  . Alcohol use: Yes    Comment: occasional  . Drug use: No  . Sexual activity: Yes  Lifestyle  . Physical activity    Days per week: Not on file    Minutes per session: Not on  file  . Stress: Not on file  Relationships  . Social Herbalist on phone: Not on file    Gets together: Not on file    Attends religious service: Not on file    Active member of club or organization: Not on file    Attends meetings of clubs or organizations: Not on file    Relationship status: Not on file  . Intimate partner violence    Fear of current or ex partner: Not on file    Emotionally abused: Not on file    Physically abused: Not on file    Forced sexual activity: Not on file  Other Topics Concern  . Not on file  Social History Narrative  . Not on file     Review of Systems: General: negative for chills, fever, night sweats or weight changes.  Cardiovascular: negative for chest pain, dyspnea on exertion, edema, orthopnea, palpitations, paroxysmal nocturnal dyspnea or shortness of breath Dermatological: negative for rash Respiratory: negative for cough or wheezing Urologic: negative for hematuria Abdominal: negative for nausea, vomiting, diarrhea, bright red blood per rectum, melena, or hematemesis Neurologic: negative for visual changes, syncope, or dizziness All other systems reviewed and are otherwise negative except as noted above.    Blood pressure (!) 142/88, pulse 71, temperature 97.9 F (36.6 C), height 6' (1.829 m), weight 269 lb (122 kg), SpO2 96 %.  General appearance: alert and no distress Neck: no adenopathy, no carotid bruit, no JVD, supple, symmetrical, trachea midline and thyroid not enlarged, symmetric, no tenderness/mass/nodules Lungs: clear to auscultation bilaterally Heart: regular rate and rhythm, S1, S2 normal, no murmur, click, rub or gallop Neurologic: Alert and oriented X 3, normal strength and tone. Normal symmetric reflexes. Normal coordination and gait  EKG sinus rhythm at 60 with nonspecific ST and T wave changes.  I personally reviewed this EKG.  ASSESSMENT AND PLAN:   CAD (coronary artery disease) , stents to LCX & distal  RCA in acute setting 07/2009, now nonobstructive ISR will treat medically History of CAD status post stenting of the proximal circumflex and distal RCA by myself in setting of acute coronary syndrome February 2011.  He was recatheterized 10/26/2009 by Dr. Claiborne Billings in the setting of ACS documented patent circumflex stent and high grade in-stent restenosis within the distal RCA stent which underwent Cutting Balloon atherectomy.  His last cath by myself 06/19/2011 revealed patent stents with moderate distal circumflex disease.  FFR suggested this was not physiologically significant and medical therapy was recommended.  He said no recurrent symptoms.  Essential hypertension History of essential hypertension with blood pressure measured today 142/88.  He is on amlodipine, lisinopril, hydrochlorothiazide and Bystolic.  Hyperlipidemia History of hyperlipidemia on Crestor with lipid profile performed 11/09/2017 revealing total cholesterol 170, LDL of 89 and HDL 44.  I am going to check a fasting lipid and liver profile and  adjust his medications appropriately      Lorretta Harp MD [facility], [facility] 01/04/2019 8:33 AM

## 2019-02-05 ENCOUNTER — Other Ambulatory Visit: Payer: Self-pay | Admitting: Cardiovascular Disease

## 2019-02-17 ENCOUNTER — Ambulatory Visit (INDEPENDENT_AMBULATORY_CARE_PROVIDER_SITE_OTHER): Payer: 59 | Admitting: Specialist

## 2019-02-17 ENCOUNTER — Encounter: Payer: Self-pay | Admitting: Specialist

## 2019-02-17 VITALS — BP 155/77 | HR 55 | Ht 72.0 in | Wt 269.0 lb

## 2019-02-17 DIAGNOSIS — M48062 Spinal stenosis, lumbar region with neurogenic claudication: Secondary | ICD-10-CM

## 2019-02-17 DIAGNOSIS — M47812 Spondylosis without myelopathy or radiculopathy, cervical region: Secondary | ICD-10-CM

## 2019-02-17 NOTE — Progress Notes (Signed)
Office Visit Note   Patient: Kyle Arroyo           Date of Birth: 15-Mar-1951           MRN: [id number] Visit Date: 02/17/2019              Requested by: Shirline Frees, MD [address] [place],  [place] [address] PCP: Shirline Frees, MD   Assessment & Plan: Visit Diagnoses:  1. Spinal stenosis of lumbar region with neurogenic claudication     Plan: Avoid bending, stooping and avoid lifting weights greater than 10 lbs. Avoid prolong standing and walking. Avoid frequent bending and stooping  No lifting greater than 10 lbs. May use ice or moist heat for pain. Weight loss is of benefit. Handicap license is approved. Stationary bicycle and pool walking or treadmill Flexion exercises.   Follow-Up Instructions: No follow-ups on file.   Orders:  No orders of the defined types were placed in this encounter.  No orders of the defined types were placed in this encounter.     Procedures: No procedures performed   Clinical Data: No additional findings.   Subjective: Chief Complaint  Patient presents with   Spine - Pain, Follow-up    68 year old right handed male with history of neck pain and radiating into the left radial two fingers. Decreased with abduction of the left arm and then when pressing on the left side of his neck. He originally saw Dr. Marlou Sa and complained of left hand numbness nocturnal improved with raising the left arm over head. Massage chair helped.  There was no weakness or dropping of items. Bowel and bladder without difficulty. History of left arm biceps tendon  Rupture. Post left bicep tendon. He has no neck or arm or hand numbness now for weeks and is really more concerned about his beet with night numbness. He notices numbness with lying flat on the back, has pain in the back greater than legs. Lying at night is when the feet hurt and get numb usually within 15 minutes. It's usually one side or the other right more often than the  left. Has back pain with standing and walking. Tends to lean on cart at grocery or [employer]. Can hike for a mile or 2, with a back pack with books. Some toe sharp pains and feelings like ant bites. No bites.   Review of Systems  Constitutional: Positive for activity change. Negative for appetite change, chills, diaphoresis, fatigue, fever and unexpected weight change.  HENT: Negative.  Negative for congestion, dental problem, drooling, ear discharge, ear pain, facial swelling, hearing loss, mouth sores, nosebleeds, postnasal drip, rhinorrhea, sinus pressure, sinus pain, sneezing, sore throat, tinnitus, trouble swallowing and voice change.   Eyes: Positive for visual disturbance.  Respiratory: Negative.  Negative for apnea, cough, choking, chest tightness, shortness of breath, wheezing and stridor.   Cardiovascular: Negative.  Negative for chest pain, palpitations and leg swelling.  Gastrointestinal: Negative.  Negative for abdominal distention, abdominal pain, anal bleeding, blood in stool, constipation, diarrhea, nausea and rectal pain.  Endocrine: Negative for cold intolerance, heat intolerance, polydipsia, polyphagia and polyuria.  Genitourinary: Negative.  Negative for difficulty urinating, dysuria, enuresis, flank pain, frequency and urgency.  Musculoskeletal: Positive for back pain and neck pain. Negative for arthralgias, gait problem, joint swelling, myalgias and neck stiffness.  Skin: Negative for color change, pallor, rash and wound.  Allergic/Immunologic: Negative for environmental allergies, food allergies and immunocompromised state.  Neurological: Positive  for weakness and numbness. Negative for dizziness, tremors, seizures, syncope, facial asymmetry, speech difficulty, light-headedness and headaches.  Hematological: Negative for adenopathy. Does not bruise/bleed easily.  Psychiatric/Behavioral: Negative for agitation, behavioral problems, confusion, decreased concentration,  dysphoric mood, hallucinations, self-injury, sleep disturbance and suicidal ideas. The patient is not nervous/anxious and is not hyperactive.      Objective: Vital Signs: BP (!) 155/77    Pulse (!) 55    Ht 6' (1.829 m)    Wt 269 lb (122 kg)    BMI 36.48 kg/m   Physical Exam Constitutional:      Appearance: He is well-developed.  HENT:     Head: Normocephalic and atraumatic.  Eyes:     Pupils: Pupils are equal, round, and reactive to light.  Neck:     Musculoskeletal: Normal range of motion and neck supple.  Pulmonary:     Effort: Pulmonary effort is normal.     Breath sounds: Normal breath sounds.  Abdominal:     General: Bowel sounds are normal.     Palpations: Abdomen is soft.  Skin:    General: Skin is warm and dry.  Neurological:     Mental Status: He is alert and oriented to person, place, and time.  Psychiatric:        Behavior: Behavior normal.        Thought Content: Thought content normal.        Judgment: Judgment normal.     Back Exam   Tenderness  The patient is experiencing tenderness in the lumbar and cervical.  Range of Motion  Extension: abnormal  Flexion: abnormal  Lateral bend right: abnormal  Lateral bend left: abnormal  Rotation right: abnormal  Rotation left: abnormal   Muscle Strength  Right Quadriceps:  5/5  Left Quadriceps:  5/5  Right Hamstrings:  5/5  Left Hamstrings:  5/5   Tests  Straight leg raise right: negative Straight leg raise left: negative  Reflexes  Patellar: 2/4 Achilles: 2/4 Biceps: abnormal Babinski's sign: normal   Other  Toe walk: normal Heel walk: normal Sensation: normal Gait: normal  Erythema: no back redness Scars: absent  Comments:  UE motor is normal LE motor is normal      Specialty Comments:  No specialty comments available.  Imaging: No results found.   PMFS History: Patient Active Problem List   Diagnosis Date Noted   Peripheral neuropathy 12/30/2017   Essential hypertension  02/28/2014   Hyperlipidemia 02/28/2014   CAD (coronary artery disease) , stents to LCX & distal RCA in acute setting 07/2009, now nonobstructive ISR will treat medically 06/20/2011   Anemia, transfusing 2 units PRBCs. 06/20/2011   Abnormal stress test 06/20/2011   Rectal bleeding 06/19/2011   Microcytic anemia 06/19/2011   Obesity (BMI 30-39.9) 06/19/2011   Type 2 diabetes mellitus ([place]) 06/19/2011   Past Medical History:  Diagnosis Date   Abnormal stress test 06/20/2011   Acute coronary syndrome ([place]) 2011   Anemia 06/20/2011   Bright red rectal bleeding    CAD (coronary artery disease) , stents to LCX & distal RCA in acute setting 07/2009 06/20/2011   Coronary artery disease    Diabetes mellitus    type 2   Hyperlipidemia    Hypertension    Microcytic anemia 2012/13   Myocardial infarction ([place])    Obesity (BMI 30-39.9)    Peripheral neuropathy 12/30/2017   Shortness of breath     Family History  Problem Relation Age of Onset   Colon  cancer Neg Hx     Past Surgical History:  Procedure Laterality Date   CORONARY ANGIOPLASTY  06/2011   EXCISIONAL HEMORRHOIDECTOMY  1980's   LEFT HEART CATHETERIZATION WITH CORONARY ANGIOGRAM N/A 06/19/2011   Procedure: LEFT HEART CATHETERIZATION WITH CORONARY ANGIOGRAM;  Surgeon: Lorretta Harp, MD;  Location: [facility] CATH LAB;  Service: Cardiovascular;  Laterality: N/A;   srabismus surgery     childhood   TESTICLE REMOVAL  2007   benign testicle mass, right   Social History   Occupational History   Not on file  Tobacco Use   Smoking status: Former Smoker    Quit date: 06/25/2008    Years since quitting: 10.6   Smokeless tobacco: Never Used   Tobacco comment: quit in 2010  Substance and Sexual Activity   Alcohol use: Yes    Comment: occasional   Drug use: No   Sexual activity: Yes

## 2019-02-17 NOTE — Patient Instructions (Signed)
Avoid bending, stooping and avoid lifting weights greater than 10 lbs. Avoid prolong standing and walking. Avoid frequent bending and stooping  No lifting greater than 10 lbs. May use ice or moist heat for pain. Weight loss is of benefit. Handicap license is approved. Stationary bicycle and pool walking or treadmill Flexion exercises.

## 2019-08-06 ENCOUNTER — Other Ambulatory Visit: Payer: Self-pay | Admitting: Cardiovascular Disease

## 2019-09-14 ENCOUNTER — Other Ambulatory Visit: Payer: Self-pay | Admitting: Cardiovascular Disease

## 2019-11-11 ENCOUNTER — Other Ambulatory Visit: Payer: Self-pay | Admitting: Cardiovascular Disease

## 2019-12-04 ENCOUNTER — Other Ambulatory Visit: Payer: Self-pay | Admitting: Cardiovascular Disease

## 2020-01-19 ENCOUNTER — Other Ambulatory Visit: Payer: Self-pay | Admitting: Cardiovascular Disease

## 2020-02-24 ENCOUNTER — Other Ambulatory Visit: Payer: Self-pay | Admitting: Cardiovascular Disease

## 2020-02-27 ENCOUNTER — Other Ambulatory Visit: Payer: Self-pay | Admitting: Cardiovascular Disease

## 2020-10-16 ENCOUNTER — Other Ambulatory Visit: Payer: Self-pay | Admitting: Cardiovascular Disease

## 2020-11-29 ENCOUNTER — Other Ambulatory Visit: Payer: Self-pay | Admitting: Cardiovascular Disease

## 2020-12-16 ENCOUNTER — Other Ambulatory Visit: Payer: Self-pay | Admitting: Cardiovascular Disease

## 2021-01-02 IMAGING — MR MRI LUMBAR SPINE WITHOUT CONTRAST
4 of 5 series · 25 of 48 positions shown · non-contrast
Comparison: None.

CLINICAL DATA: Low back pain with numbness into both legs and feet
for 1 year. Progressive symptoms.

EXAM:
MRI LUMBAR SPINE WITHOUT CONTRAST
TECHNIQUE: Multiplanar, multisequence MR imaging of the lumbar spine was
performed. No intravenous contrast was administered.

[Series 3: T2 post-contrast · sagittal · 4.0mm · 0.55mm/px · 6 of 13 slices shown]
[im 1/13]
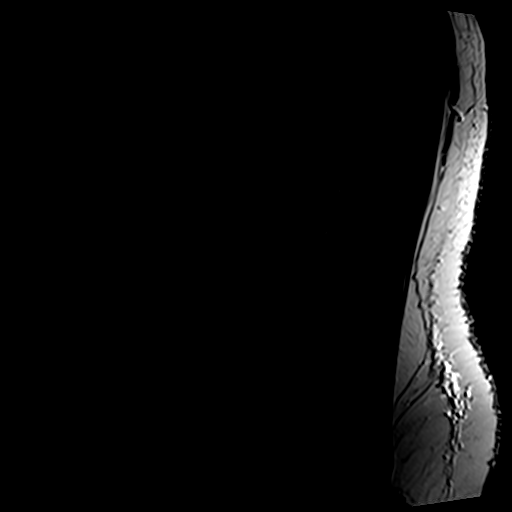
[im 3/13]
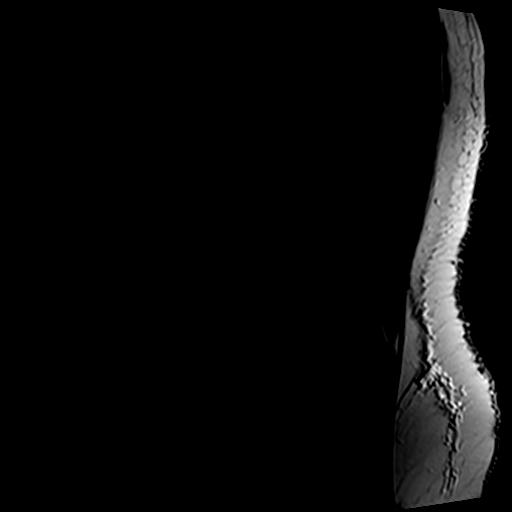
[im 5/13]
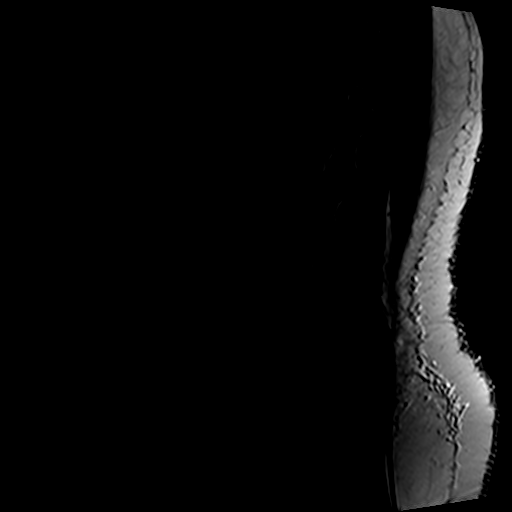
[im 8/13]
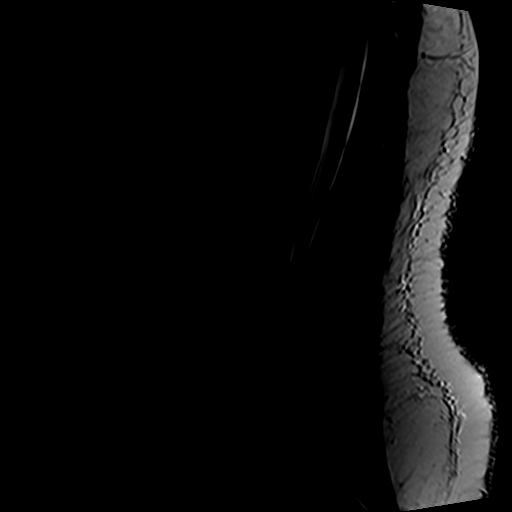
[im 10/13]
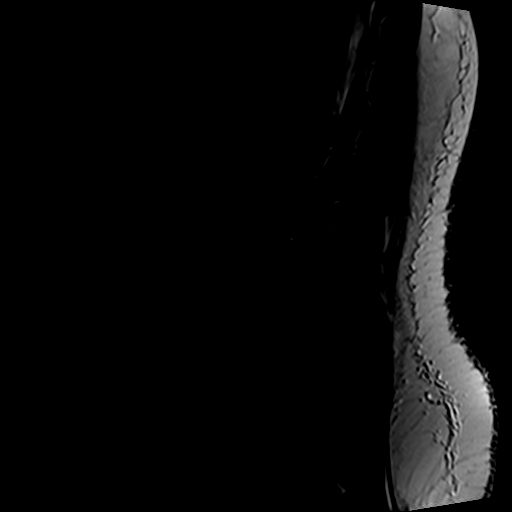
[im 13/13]
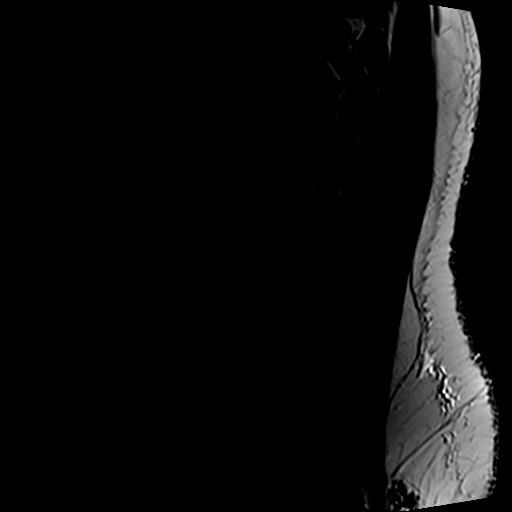

[Series 5: T1 · sagittal · 4.0mm · 0.55mm/px · 6 of 16 slices shown (1 of 2)]
[im 1/16]
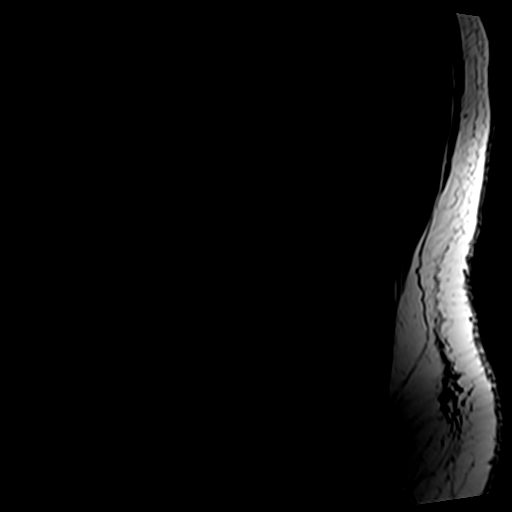
[im 4/16]
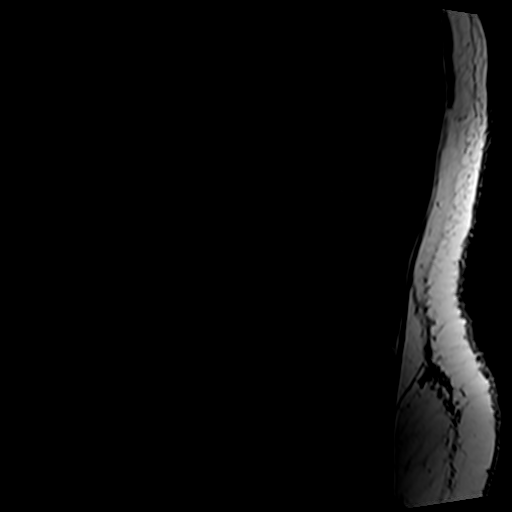
[im 7/16]
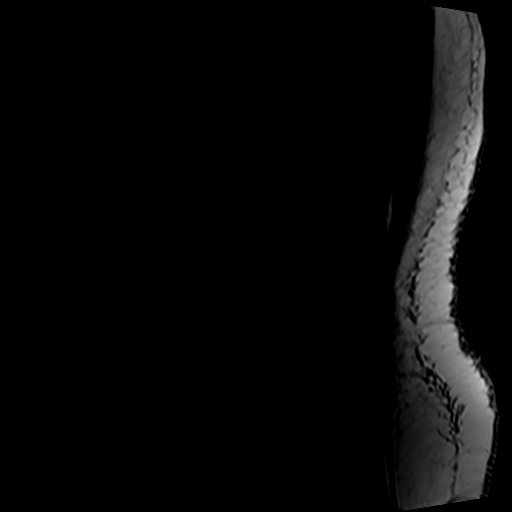
[im 10/16]
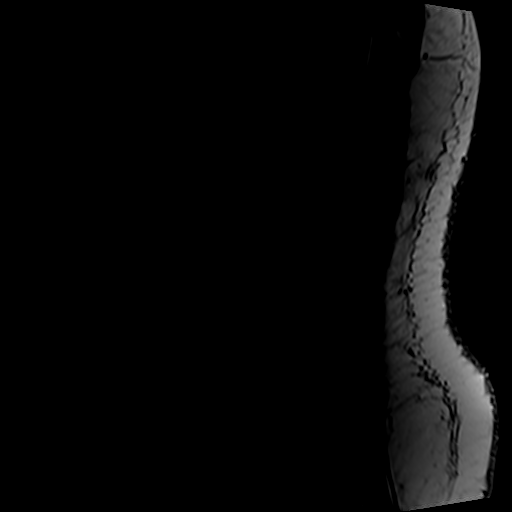
[im 13/16]
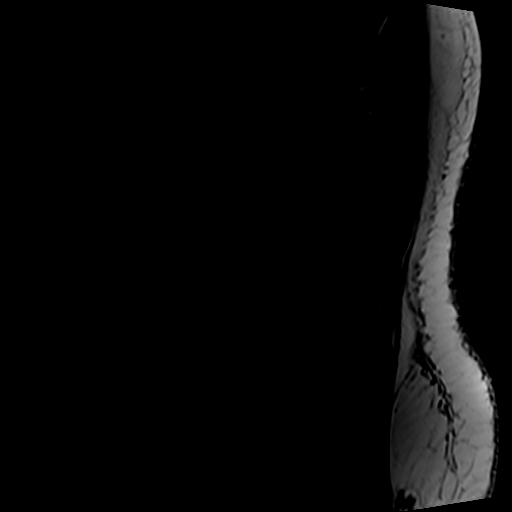
[im 16/16]
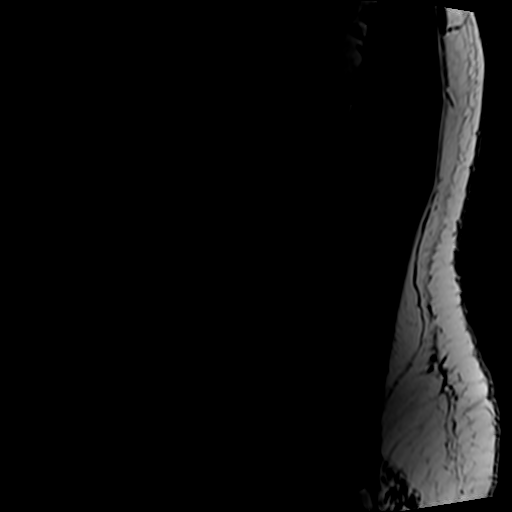

[Series 6: T2 · axial · 4.0mm · 0.70mm/px · z∈[-124,+86]mm · 9 of 37 slices shown]
[im 1/37]
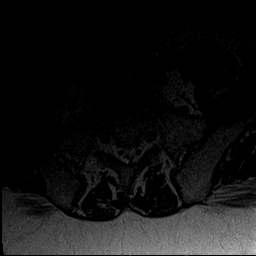
[im 6/37]
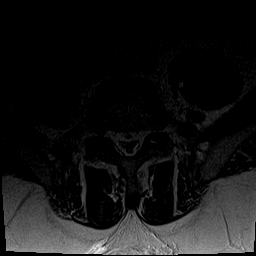
[im 11/37]
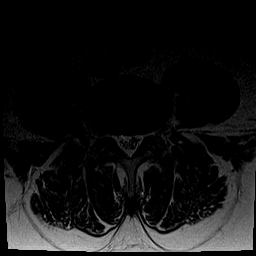
[im 16/37]
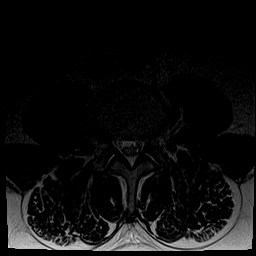
[im 19/37]
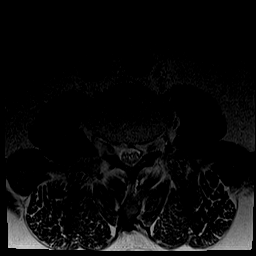
[im 21/37]
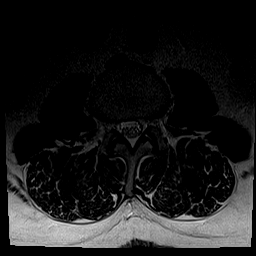
[im 26/37]
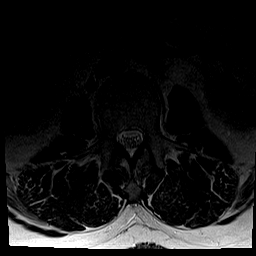
[im 31/37]
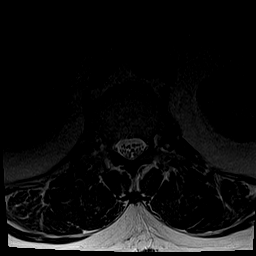
[im 37/37]
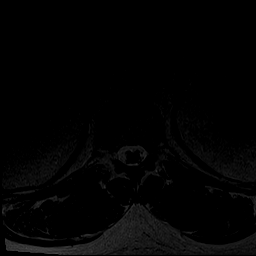

[Series 7: T1 · axial · 4.0mm · 0.35mm/px · z∈[-124,+55]mm · 4 of 37 slices shown (2 of 2)]
[im 1/37]
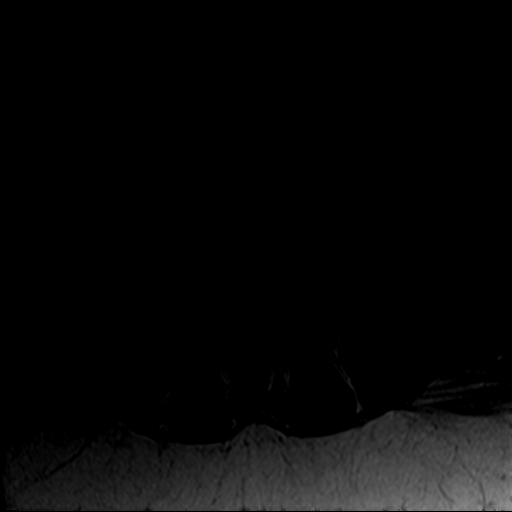
[im 6/37]
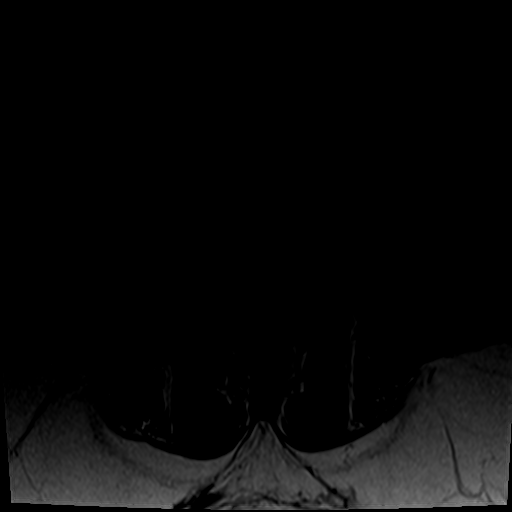
[im 19/37]
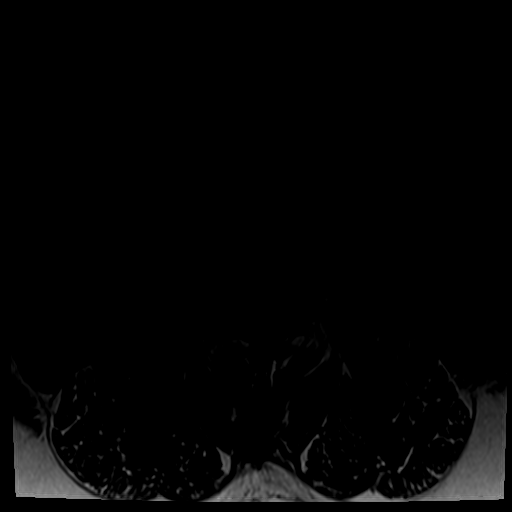
[im 31/37]
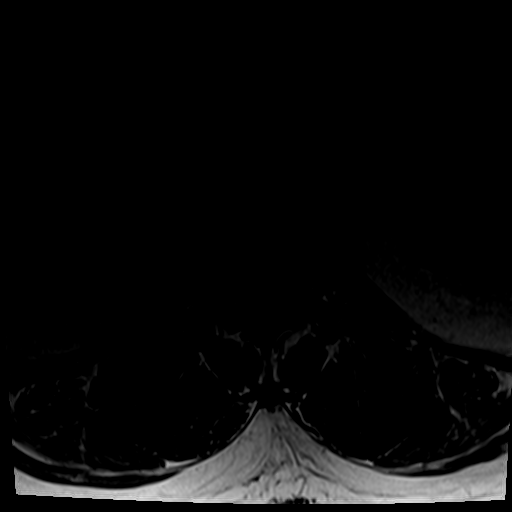

[25 of 48 positions shown; findings below may reference images not displayed]

FINDINGS: Segmentation: 5 non rib-bearing lumbar type vertebral bodies are
present. The lowest fully formed vertebral body is L5.

Alignment: AP alignment scratched at slight retrolisthesis is
present at L5-S1. AP alignment is otherwise anatomic. There is no
significant curvature.

Vertebrae: Mixed sclerotic and fatty endplate are present marrow
changes at L5-S1 bilaterally. Small hemangioma is present
posteriorly on the right at T12 marrow signal and vertebral body
heights are otherwise normal.

Conus medullaris and cauda equina: Conus extends to the L1 level.
Conus and cauda equina appear normal.

Paraspinal and other soft tissues: A well-defined left renal cyst
measures 10 mm. Limited imaging of the abdomen is otherwise
unremarkable. There is no significant adenopathy.

Disc levels:

L1-2: Negative.

L2-3: Mild disc bulging is present without significant stenosis.

L3-4: A broad-based disc protrusion is present. Mild facet
hypertrophy is evident bilaterally. Combination results in mild
bilateral foraminal narrowing.

L4-5: A broad-based disc protrusion is present. Mild facet
hypertrophy is noted. Prominent epidural fat is present. No
compressive central stenosis is present. Mild foraminal narrowing is
worse right than left.

L5-S1: Prominent epidural fat compresses thecal sac. A broad-based
disc protrusion is present. This results in mild foraminal narrowing
bilaterally.
IMPRESSION: 1. Epidural lipomatosis with narrowing of the thecal sac at L4-5 and
L5-S1.
2. Mild foraminal narrowing bilaterally at L3-4, L4-5, and L5-S1.
3. Disc disease distorts the central canal at L5-S1 without a
significant central stenosis.

## 2021-02-06 ENCOUNTER — Other Ambulatory Visit: Payer: Self-pay | Admitting: Cardiovascular Disease

## 2021-03-20 ENCOUNTER — Encounter: Payer: Self-pay | Admitting: Cardiovascular Disease

## 2021-03-20 ENCOUNTER — Other Ambulatory Visit: Payer: Self-pay

## 2021-03-20 ENCOUNTER — Ambulatory Visit (INDEPENDENT_AMBULATORY_CARE_PROVIDER_SITE_OTHER): Payer: 59 | Admitting: Cardiovascular Disease

## 2021-03-20 DIAGNOSIS — E782 Mixed hyperlipidemia: Secondary | ICD-10-CM

## 2021-03-20 DIAGNOSIS — I2510 Atherosclerotic heart disease of native coronary artery without angina pectoris: Secondary | ICD-10-CM | POA: Diagnosis not present

## 2021-03-20 DIAGNOSIS — I10 Essential (primary) hypertension: Secondary | ICD-10-CM | POA: Diagnosis not present

## 2021-03-20 MED ORDER — ROSUVASTATIN CALCIUM 40 MG PO TABS: 40.0000 mg | ORAL_TABLET | Freq: Every day | ORAL | 3 refills | Status: DC

## 2021-03-20 NOTE — Progress Notes (Signed)
03/20/2021 Kyle Arroyo   1950-06-18  [id number]  Primary Physician Shirline Frees, MD Primary Cardiologist: Lorretta Harp MD FACP, [place], [place], [place]  HPI:  Kyle Arroyo is a 70 y.o.  moderately overweight married Caucasian male, father of 46, grandfather to one grandchild who I last saw in the office 01/04/2019.Marland KitchenHe  has a history of CAD status post stenting of his proximal circumflex and distal right coronary artery in the setting of acute coronary syndrome in February of 2011.I last saw him in the office 02/28/14. His other problems include discontinue tobacco abuse at that time, hypertension and hyperlipidemia. He was recatheterized by Dr. Claiborne Billings 5/27/11n the setting of acute coronary syndrome with documented patent circumflex stent and high-grade in-stent restenosis and put within the distal right coronary artery stent. Dr. Claiborne Billings performed cutting balloon angioplasty with an excellent result. He has been complaining of increasing dyspnea on exertion or lower extremity edema for the prior 2 weeks and was aware of salt restriction. I obtained a 2-D echo which showed essentially normal bili function and a Myoview stress test that showed normal perfusion images. However, on exercise he did have 2-3 mm of ST segment depression. In addition, his preprocedure labs were remarkable for hemoglobin of 8.1 which was a new finding. He was transfused resulted in marked improvement in his symptoms.Because of the symptoms of his abnormal electrocardiographic response to exercise he underwent cardiac catheterization by myself 06/19/11 revealed patent stents with moderate disease in the circumflex. I performed FFR on this revealing it to be not physiologically significant and medical therapy was recommended.   Since I saw him over a year ago he is remained stable and denies chest pain or shortness of breath.  He does complain of some numbness in his legs which sounds like peripheral neuropathy.   Current Meds   Medication Sig   amLODipine (NORVASC) 5 MG tablet TAKE 1 TABLET BY MOUTH  DAILY   aspirin 325 MG tablet Take 325 mg by mouth daily.   glimepiride (AMARYL) 4 MG tablet    lisinopril-hydrochlorothiazide (ZESTORETIC) 10-12.5 MG tablet TAKE 1 TABLET BY MOUTH  TWICE DAILY   metFORMIN (GLUCOPHAGE) 500 MG tablet Take 2 tablets (1000mg ) in the morning and 1 tablet (500mg ) at night   nebivolol (BYSTOLIC) 10 MG tablet Take 1 tablet (10 mg total) by mouth daily.   Omega 3 1000 MG CAPS Take 1 capsule by mouth daily.   OVER THE COUNTER MEDICATION Take 1 tablet by mouth daily. Co-q-10   prasugrel (EFFIENT) 10 MG TABS tablet TAKE 1 TABLET BY MOUTH  DAILY   [DISCONTINUED] rosuvastatin (CRESTOR) 20 MG tablet TAKE 1 TABLET BY MOUTH  DAILY     Allergies  Allergen Reactions   Levofloxacin Shortness Of Breath and Swelling    Severe swelling of legs and testicles    Social History   Socioeconomic History   Marital status: Married    Spouse name: Not on file   Number of children: Not on file   Years of education: Not on file   Highest education level: Not on file  Occupational History   Not on file  Tobacco Use   Smoking status: Former    Types: Cigarettes    Quit date: 06/25/2008    Years since quitting: 12.7   Smokeless tobacco: Never   Tobacco comments:    quit in 2010  Substance and Sexual Activity   Alcohol use: Yes    Comment: occasional   Drug use:  No   Sexual activity: Yes  Other Topics Concern   Not on file  Social History Narrative   Not on file   Social Determinants of Health   Financial Resource Strain: Not on file  Food Insecurity: Not on file  Transportation Needs: Not on file  Physical Activity: Not on file  Stress: Not on file  Social Connections: Not on file  Intimate Partner Violence: Not on file     Review of Systems: General: negative for chills, fever, night sweats or weight changes.  Cardiovascular: negative for chest pain, dyspnea on exertion, edema,  orthopnea, palpitations, paroxysmal nocturnal dyspnea or shortness of breath Dermatological: negative for rash Respiratory: negative for cough or wheezing Urologic: negative for hematuria Abdominal: negative for nausea, vomiting, diarrhea, bright red blood per rectum, melena, or hematemesis Neurologic: negative for visual changes, syncope, or dizziness All other systems reviewed and are otherwise negative except as noted above.    Blood pressure (!) 152/92, pulse 63, height 6' (1.829 m), weight 263 lb (119.3 kg), SpO2 95 %.  General appearance: alert and no distress Neck: no adenopathy, no carotid bruit, no JVD, supple, symmetrical, trachea midline, and thyroid not enlarged, symmetric, no tenderness/mass/nodules Lungs: clear to auscultation bilaterally Heart: regular rate and rhythm, S1, S2 normal, no murmur, click, rub or gallop Extremities: extremities normal, atraumatic, no cyanosis or edema Pulses: 2+ and symmetric Skin: Skin color, texture, turgor normal. No rashes or lesions Neurologic: Grossly normal  EKG sinus rhythm at 63 with nonspecific ST and T wave changes and occasional PVCs.  I personally reviewed this EKG.  ASSESSMENT AND PLAN:   CAD (coronary artery disease) , stents to LCX & distal RCA in acute setting 07/2009, now nonobstructive ISR will treat medically History of CAD status post stenting of the proximal circumflex and distal right coronary artery in the setting of acute coronary syndrome February 2011.  He was recatheterized by Dr. Claiborne Billings 10/26/2009 in the setting of ACS and a patent circumflex stent but in-stent restenosis within the distal RCA stent which underwent Cutting Balloon atherectomy with an excellent result.  I recatheterized him 06/19/2011 revealing patent stents with moderate disease in the circumflex.  I performed FFR on him at that time revealing a lesion not to be physiologically significant and recommended medical therapy.  He has been asymptomatic since  that time.  Essential hypertension History of essential hypertension a blood pressure measured today at 152/92.  He says he checks his blood pressure at home and its usually much better than this.  He is on amlodipine, lisinopril, hydrochlorothiazide and Bystolic.  Hyperlipidemia History of hyperlipidemia on Crestor 20 mg a day with lipid profile performed 12/10/2020 revealing total cholesterol 178, LDL of 103 and HDL 41.  He is not at goal for secondary prevention.  I am going to increase his Crestor from 20 to 40 mg a day and we will recheck a lipid liver profile in 3 months.  He does admit to dietary indiscretion.     Lorretta Harp MD FACP,FACC,FAHA, [facility] 03/20/2021 10:14 AM

## 2021-03-20 NOTE — Patient Instructions (Signed)
Medication Instructions:   -Increase rosuvastatin (crestor) 40mg  once daily.   *If you need a refill on your cardiac medications before your next appointment, please call your pharmacy*   Lab Work: Your physician recommends that you return for lab work in: 3 months for FASTING lipid/liver profile.  If you have labs (blood work) drawn today and your tests are completely normal, you will receive your results only by: [place] (if you have MyChart) OR A paper copy in the mail If you have any lab test that is abnormal or we need to change your treatment, we will call you to review the results.   Follow-Up: At [facility], you and your health needs are our priority.  As part of our continuing mission to provide you with exceptional heart care, we have created designated Provider Care Teams.  These Care Teams include your primary Cardiologist (physician) and Advanced Practice Providers (APPs -  Physician Assistants and Nurse Practitioners) who all work together to provide you with the care you need, when you need it.  We recommend signing up for the patient portal called "MyChart".  Sign up information is provided on this After Visit Summary.  MyChart is used to connect with patients for Virtual Visits (Telemedicine).  Patients are able to view lab/test results, encounter notes, upcoming appointments, etc.  Non-urgent messages can be sent to your provider as well.   To learn more about what you can do with MyChart, go to [email or web address].    Your next appointment:   12 month(s)  The format for your next appointment:   In Person  Provider:   Quay Burow, MD

## 2021-03-20 NOTE — Assessment & Plan Note (Signed)
History of hyperlipidemia on Crestor 20 mg a day with lipid profile performed 12/10/2020 revealing total cholesterol 178, LDL of 103 and HDL 41.  He is not at goal for secondary prevention.  I am going to increase his Crestor from 20 to 40 mg a day and we will recheck a lipid liver profile in 3 months.  He does admit to dietary indiscretion.

## 2021-03-20 NOTE — Assessment & Plan Note (Signed)
History of essential hypertension a blood pressure measured today at 152/92.  He says he checks his blood pressure at home and its usually much better than this.  He is on amlodipine, lisinopril, hydrochlorothiazide and Bystolic.

## 2021-03-20 NOTE — Assessment & Plan Note (Signed)
History of CAD status post stenting of the proximal circumflex and distal right coronary artery in the setting of acute coronary syndrome February 2011.  He was recatheterized by Dr. Claiborne Billings 10/26/2009 in the setting of ACS and a patent circumflex stent but in-stent restenosis within the distal RCA stent which underwent Cutting Balloon atherectomy with an excellent result.  I recatheterized him 06/19/2011 revealing patent stents with moderate disease in the circumflex.  I performed FFR on him at that time revealing a lesion not to be physiologically significant and recommended medical therapy.  He has been asymptomatic since that time.

## 2022-04-17 ENCOUNTER — Other Ambulatory Visit: Payer: Self-pay | Admitting: Cardiovascular Disease

## 2022-05-21 ENCOUNTER — Other Ambulatory Visit: Payer: Self-pay | Admitting: Cardiovascular Disease

## 2022-06-17 ENCOUNTER — Encounter: Payer: Self-pay | Admitting: Cardiovascular Disease

## 2022-06-17 ENCOUNTER — Ambulatory Visit: Payer: 59 | Attending: Cardiovascular Disease | Admitting: Cardiovascular Disease

## 2022-06-17 VITALS — BP 136/74 | HR 62 | Ht 72.0 in | Wt 259.0 lb

## 2022-06-17 DIAGNOSIS — I10 Essential (primary) hypertension: Secondary | ICD-10-CM

## 2022-06-17 DIAGNOSIS — I2510 Atherosclerotic heart disease of native coronary artery without angina pectoris: Secondary | ICD-10-CM | POA: Diagnosis not present

## 2022-06-17 DIAGNOSIS — E782 Mixed hyperlipidemia: Secondary | ICD-10-CM | POA: Diagnosis not present

## 2022-06-17 LAB — HEPATIC FUNCTION PANEL
ALT: 29 IU/L (ref 0–44)
AST: 25 IU/L (ref 0–40)
Albumin: 4.8 g/dL (ref 3.9–4.9)
Alkaline Phosphatase: 66 IU/L (ref 44–121)
Bilirubin Total: 0.6 mg/dL (ref 0.0–1.2)
Bilirubin, Direct: 0.17 mg/dL (ref 0.00–0.40)
Total Protein: 6.7 g/dL (ref 6.0–8.5)

## 2022-06-17 LAB — LIPID PANEL
Chol/HDL Ratio: 3.6 ratio (ref 0.0–5.0)
Cholesterol, Total: 153 mg/dL (ref 100–199)
HDL: 42 mg/dL (ref >39)
LDL Chol Calc (NIH): 86 mg/dL (ref 0–99)
Triglycerides: 139 mg/dL (ref 0–149)
VLDL Cholesterol Cal: 25 mg/dL (ref 5–40)

## 2022-06-17 NOTE — Progress Notes (Signed)
06/17/2022 Kyle Arroyo   1950/09/12  [id number]  Primary Physician Shirline Frees, MD Primary Cardiologist: Lorretta Harp MD FACP, [place], [place], [place]  HPI:  Kyle Arroyo is a 72 y.o.  moderately overweight married Caucasian male, father of 64, grandfather to one grandchild who I last saw in the office 01/04/2019.Marland KitchenHe  has a history of CAD status post stenting of his proximal circumflex and distal right coronary artery in the setting of acute coronary syndrome in February of 2011.I last saw him in the office 03/20/2021. His other problems include discontinue tobacco abuse at that time, hypertension and hyperlipidemia. He was recatheterized by Dr. Claiborne Billings 5/27/11n the setting of acute coronary syndrome with documented patent circumflex stent and high-grade in-stent restenosis and put within the distal right coronary artery stent. Dr. Claiborne Billings performed cutting balloon angioplasty with an excellent result. He has been complaining of increasing dyspnea on exertion or lower extremity edema for the prior 2 weeks and was aware of salt restriction. I obtained a 2-D echo which showed essentially normal bili function and a Myoview stress test that showed normal perfusion images. However, on exercise he did have 2-3 mm of ST segment depression. In addition, his preprocedure labs were remarkable for hemoglobin of 8.1 which was a new finding. He was transfused resulted in marked improvement in his symptoms.Because of the symptoms of his abnormal electrocardiographic response to exercise he underwent cardiac catheterization by myself 06/19/11 revealed patent stents with moderate disease in the circumflex. I performed FFR on this revealing it to be not physiologically significant and medical therapy was recommended.   Since I saw him over a year ago he is remained stable and denies chest pain or shortness of breath.  He does complain of some numbness in his legs which sounds like peripheral neuropathy.    Current  Meds  Medication Sig   amLODipine (NORVASC) 5 MG tablet TAKE 1 TABLET BY MOUTH  DAILY   aspirin 325 MG tablet Take 325 mg by mouth daily.   glimepiride (AMARYL) 4 MG tablet    lisinopril-hydrochlorothiazide (ZESTORETIC) 10-12.5 MG tablet TAKE 1 TABLET BY MOUTH  TWICE DAILY   metFORMIN (GLUCOPHAGE) 500 MG tablet Take 2 tablets ('1000mg'$ ) in the morning and 1 tablet ('500mg'$ ) at night   Omega 3 1000 MG CAPS Take 1 capsule by mouth daily.   OVER THE COUNTER MEDICATION Take 1 tablet by mouth daily. Co-q-10   prasugrel (EFFIENT) 10 MG TABS tablet TAKE 1 TABLET BY MOUTH  DAILY   rosuvastatin (CRESTOR) 40 MG tablet Take 1 tablet (40 mg total) by mouth daily. Please keep scheduled appointment for further refills     Allergies  Allergen Reactions   Levofloxacin Shortness Of Breath and Swelling    Severe swelling of legs and testicles    Social History   Socioeconomic History   Marital status: Married    Spouse name: Not on file   Number of children: Not on file   Years of education: Not on file   Highest education level: Not on file  Occupational History   Not on file  Tobacco Use   Smoking status: Former    Types: Cigarettes    Quit date: 06/25/2008    Years since quitting: 13.9   Smokeless tobacco: Never   Tobacco comments:    quit in 2010  Substance and Sexual Activity   Alcohol use: Yes    Comment: occasional   Drug use: No   Sexual activity: Yes  Other Topics Concern   Not on file  Social History Narrative   Not on file   Social Determinants of Health   Financial Resource Strain: Not on file  Food Insecurity: Not on file  Transportation Needs: Not on file  Physical Activity: Not on file  Stress: Not on file  Social Connections: Not on file  Intimate Partner Violence: Not on file     Review of Systems: General: negative for chills, fever, night sweats or weight changes.  Cardiovascular: negative for chest pain, dyspnea on exertion, edema, orthopnea, palpitations,  paroxysmal nocturnal dyspnea or shortness of breath Dermatological: negative for rash Respiratory: negative for cough or wheezing Urologic: negative for hematuria Abdominal: negative for nausea, vomiting, diarrhea, bright red blood per rectum, melena, or hematemesis Neurologic: negative for visual changes, syncope, or dizziness All other systems reviewed and are otherwise negative except as noted above.    Blood pressure 136/74, pulse 62, height 6' (1.829 m), weight 259 lb (117.5 kg).  General appearance: alert and no distress Neck: no adenopathy, no carotid bruit, no JVD, supple, symmetrical, trachea midline, and thyroid not enlarged, symmetric, no tenderness/mass/nodules Lungs: clear to auscultation bilaterally Heart: regular rate and rhythm, S1, S2 normal, no murmur, click, rub or gallop Extremities: extremities normal, atraumatic, no cyanosis or edema Pulses: 2+ and symmetric Skin: Skin color, texture, turgor normal. No rashes or lesions Neurologic: Grossly normal  EKG sinus rhythm at 62 without ST or T wave changes.  Personally reviewed this EKG.  ASSESSMENT AND PLAN:   CAD (coronary artery disease) , stents to LCX & distal RCA in acute setting 07/2009, now nonobstructive ISR will treat medically History of CAD status post proximal circumflex and RCA stenting in the setting of acute coronary syndrome by myself February 2011.  He was recatheterized by Dr. Claiborne Billings 10/26/2009 in the setting of acute coronary syndrome documenting patent circumflex stent with high-grade in-stent restenosis within the distal RCA stent which was we intervened on with Cutting Balloon.  I last catheterized him 06/19/2011 revealing patent stents with moderate disease in the circumflex.  This was not felt to be physiologically significant by FFR evaluation.  He said no recurrent chest pain or shortness of breath.  Essential hypertension History of essential hypertension a blood pressure measured today at 136/74.  He  is on amlodipine, lisinopril, hydrochlorothiazide and Bystolic.  Hyperlipidemia History of hyperlipidemia on statin therapy.  We will recheck a lipid liver profile.     Lorretta Harp MD FACP,FACC,FAHA, [facility] 06/17/2022 8:35 AM

## 2022-06-17 NOTE — Assessment & Plan Note (Signed)
History of CAD status post proximal circumflex and RCA stenting in the setting of acute coronary syndrome by myself February 2011.  He was recatheterized by Dr. Claiborne Billings 10/26/2009 in the setting of acute coronary syndrome documenting patent circumflex stent with high-grade in-stent restenosis within the distal RCA stent which was we intervened on with Cutting Balloon.  I last catheterized him 06/19/2011 revealing patent stents with moderate disease in the circumflex.  This was not felt to be physiologically significant by FFR evaluation.  He said no recurrent chest pain or shortness of breath.

## 2022-06-17 NOTE — Assessment & Plan Note (Signed)
History of essential hypertension a blood pressure measured today at 136/74.  He is on amlodipine, lisinopril, hydrochlorothiazide and Bystolic.

## 2022-06-17 NOTE — Patient Instructions (Signed)
Medication Instructions:  No changes *If you need a refill on your cardiac medications before your next appointment, please call your pharmacy*   Lab Work: Fasting lipid and liver function today If you have labs (blood work) drawn today and your tests are completely normal, you will receive your results only by: [place] (if you have MyChart) OR A paper copy in the mail If you have any lab test that is abnormal or we need to change your treatment, we will call you to review the results.   Follow-Up: At [facility], you and your health needs are our priority.  As part of our continuing mission to provide you with exceptional heart care, we have created designated Provider Care Teams.  These Care Teams include your primary Cardiologist (physician) and Advanced Practice Providers (APPs -  Physician Assistants and Nurse Practitioners) who all work together to provide you with the care you need, when you need it.  We recommend signing up for the patient portal called "MyChart".  Sign up information is provided on this After Visit Summary.  MyChart is used to connect with patients for Virtual Visits (Telemedicine).  Patients are able to view lab/test results, encounter notes, upcoming appointments, etc.  Non-urgent messages can be sent to your provider as well.   To learn more about what you can do with MyChart, go to [email or web address].    Your next appointment:   1 year(s)  Provider:   Dr Gwenlyn Found

## 2022-06-17 NOTE — Assessment & Plan Note (Signed)
History of hyperlipidemia on statin therapy.  We will recheck a lipid liver profile 

## 2022-07-09 ENCOUNTER — Other Ambulatory Visit: Payer: Self-pay

## 2022-07-09 DIAGNOSIS — E782 Mixed hyperlipidemia: Secondary | ICD-10-CM

## 2022-07-18 ENCOUNTER — Other Ambulatory Visit: Payer: Self-pay

## 2022-07-18 MED ORDER — EZETIMIBE 10 MG PO TABS: 10.0000 mg | ORAL_TABLET | Freq: Every day | ORAL | 3 refills | Status: DC

## 2022-07-18 NOTE — Addendum Note (Signed)
Addended by: Beatrix Fetters on: 07/18/2022 02:01 PM   Modules accepted: Orders

## 2022-09-03 ENCOUNTER — Other Ambulatory Visit: Payer: Self-pay | Admitting: Cardiovascular Disease

## 2023-02-10 DIAGNOSIS — Z7982 Long term (current) use of aspirin: Secondary | ICD-10-CM | POA: Diagnosis not present

## 2023-02-10 DIAGNOSIS — E1169 Type 2 diabetes mellitus with other specified complication: Secondary | ICD-10-CM | POA: Diagnosis not present

## 2023-02-10 DIAGNOSIS — I2510 Atherosclerotic heart disease of native coronary artery without angina pectoris: Secondary | ICD-10-CM | POA: Diagnosis not present

## 2023-02-10 DIAGNOSIS — E1142 Type 2 diabetes mellitus with diabetic polyneuropathy: Secondary | ICD-10-CM | POA: Diagnosis not present

## 2023-02-10 DIAGNOSIS — I10 Essential (primary) hypertension: Secondary | ICD-10-CM | POA: Diagnosis not present

## 2023-02-10 DIAGNOSIS — G629 Polyneuropathy, unspecified: Secondary | ICD-10-CM | POA: Diagnosis not present

## 2023-02-10 DIAGNOSIS — E669 Obesity, unspecified: Secondary | ICD-10-CM | POA: Diagnosis not present

## 2023-02-10 DIAGNOSIS — E785 Hyperlipidemia, unspecified: Secondary | ICD-10-CM | POA: Diagnosis not present

## 2023-02-10 DIAGNOSIS — N400 Enlarged prostate without lower urinary tract symptoms: Secondary | ICD-10-CM | POA: Diagnosis not present

## 2023-02-10 DIAGNOSIS — Z794 Long term (current) use of insulin: Secondary | ICD-10-CM | POA: Diagnosis not present

## 2023-03-18 DIAGNOSIS — E119 Type 2 diabetes mellitus without complications: Secondary | ICD-10-CM | POA: Diagnosis not present

## 2023-06-15 ENCOUNTER — Ambulatory Visit: Payer: PPO | Attending: Cardiovascular Disease | Admitting: Cardiovascular Disease

## 2023-06-15 ENCOUNTER — Encounter: Payer: Self-pay | Admitting: Cardiovascular Disease

## 2023-06-15 VITALS — BP 126/66 | HR 61 | Ht 72.0 in | Wt 238.4 lb

## 2023-06-15 DIAGNOSIS — I2510 Atherosclerotic heart disease of native coronary artery without angina pectoris: Secondary | ICD-10-CM | POA: Diagnosis not present

## 2023-06-15 DIAGNOSIS — E782 Mixed hyperlipidemia: Secondary | ICD-10-CM | POA: Diagnosis not present

## 2023-06-15 DIAGNOSIS — I10 Essential (primary) hypertension: Secondary | ICD-10-CM

## 2023-06-15 LAB — HEPATIC FUNCTION PANEL
ALT: 34 [IU]/L (ref 0–44)
AST: 27 [IU]/L (ref 0–40)
Albumin: 4.7 g/dL (ref 3.9–4.9)
Alkaline Phosphatase: 63 [IU]/L (ref 44–121)
Bilirubin Total: 0.7 mg/dL (ref 0.0–1.2)
Bilirubin, Direct: 0.23 mg/dL (ref 0.00–0.40)
Total Protein: 6.8 g/dL (ref 6.0–8.5)

## 2023-06-15 LAB — LIPID PANEL
Chol/HDL Ratio: 3 {ratio} (ref 0.0–5.0)
Cholesterol, Total: 131 mg/dL (ref 100–199)
HDL: 43 mg/dL (ref >39)
LDL Chol Calc (NIH): 57 mg/dL (ref 0–99)
Triglycerides: 185 mg/dL — ABNORMAL HIGH (ref 0–149)
VLDL Cholesterol Cal: 31 mg/dL (ref 5–40)

## 2023-06-15 NOTE — Assessment & Plan Note (Signed)
 History of essential hypertension her blood pressure measured today at 126/66.  He is on amlodipine, lisinopril, hydrochlorothiazide and Bystolic.

## 2023-06-15 NOTE — Assessment & Plan Note (Signed)
 History of hyperlipidemia on high-dose rosuvastatin  and Zetia .  Unfortunately when his last lipid profile was performed he had stopped his rosuvastatin .  This was on 12/30/2022 which time a total cholesterol of 233, LDL of 151 and HDL was 49.  He is now on both medications.  Will recheck a lipid liver profile.  LDL goal less than 70.

## 2023-06-15 NOTE — Assessment & Plan Note (Signed)
 History of CAD status post stenting of his proximal circumflex and distal RCA by myself in the setting of acute coronary syndrome February 2011.  He was recatheterized 10/26/2009 in the setting of ACS by Dr. Burnard who documented a patent circumflex stent and high-grade in-stent recently RCA productively performed Cutting Balloon angioplasty with excellent result.  I last studied him 06/19/2011 revealing patent stents with moderate disease in the circumflex.  I performed FFR analysis which suggested this was not physiologically significant.  He is fairly active and asymptomatic.

## 2023-06-15 NOTE — Patient Instructions (Addendum)
 Medication Instructions:  Your physician recommends that you continue on your current medications as directed. Please refer to the Current Medication list given to you today.  *If you need a refill on your cardiac medications before your next appointment, please call your pharmacy*   Lab Work: Your physician recommends that you have labs drawn today: Lipid/liver panel  If you have labs (blood work) drawn today and your tests are completely normal, you will receive your results only by: MyChart Message (if you have MyChart) OR A paper copy in the mail If you have any lab test that is abnormal or we need to change your treatment, we will call you to review the results.   Follow-Up: At [facility], you and your health needs are our priority.  As part of our continuing mission to provide you with exceptional heart care, we have created designated Provider Care Teams.  These Care Teams include your primary Cardiologist (physician) and Advanced Practice Providers (APPs -  Physician Assistants and Nurse Practitioners) who all work together to provide you with the care you need, when you need it.  We recommend signing up for the patient portal called "MyChart".  Sign up information is provided on this After Visit Summary.  MyChart is used to connect with patients for Virtual Visits (Telemedicine).  Patients are able to view lab/test results, encounter notes, upcoming appointments, etc.  Non-urgent messages can be sent to your provider as well.   To learn more about what you can do with MyChart, go to [email or web address].    Your next appointment:   12 month(s)  Provider:   Nanetta Batty, MD     Other Instructions

## 2023-06-15 NOTE — Progress Notes (Signed)
 06/15/2023 Kyle Arroyo   01-29-1951  [id number]  Primary Physician Arloa Elsie JONELLE, MD Primary Cardiologist: Dorn JINNY Lesches MD FACP, [place], [place], [place]  HPI:  Kyle Arroyo is a 73 y.o.  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  He  has a history of CAD status post stenting of his proximal circumflex and distal right coronary artery in the setting of acute coronary syndrome in February of 2011.I last saw him in the office 03/20/2021. His other problems include discontinue tobacco abuse at that time, hypertension and hyperlipidemia. He was recatheterized by Dr. Burnard 5/27/11n the setting of acute coronary syndrome with documented patent circumflex stent and high-grade in-stent restenosis and put within the distal right coronary artery stent. Dr. Burnard performed cutting balloon angioplasty with an excellent result. He has been complaining of increasing dyspnea on exertion or lower extremity edema for the prior 2 weeks and was aware of salt restriction. I obtained a 2-D echo which showed essentially normal bili function and a Myoview stress test that showed normal perfusion images. However, on exercise he did have 2-3 mm of ST segment depression. In addition, his preprocedure labs were remarkable for hemoglobin of 8.1 which was a new finding. He was transfused resulted in marked improvement in his symptoms.Because of the symptoms of his abnormal electrocardiographic response to exercise he underwent cardiac catheterization by myself 06/19/11 revealed patent stents with moderate disease in the circumflex. I performed FFR on this revealing it to be not physiologically significant and medical therapy was recommended.   Since I saw him over a year ago he is remained stable and denies chest pain or shortness of breath.  He is fairly active.   He is asymptomatic.  He has lost 20 to 30 pounds and I last saw him as result of diet and exercise since he has retired.   Current Meds  Medication Sig   amLODipine  (NORVASC ) 5 MG tablet TAKE 1 TABLET BY MOUTH  DAILY   aspirin  325 MG tablet Take 325 mg by mouth daily.   ezetimibe  (ZETIA ) 10 MG tablet Take 1 tablet (10 mg total) by mouth daily.   glimepiride (AMARYL) 4 MG tablet    lisinopril -hydrochlorothiazide  (ZESTORETIC ) 10-12.5 MG tablet TAKE 1 TABLET BY MOUTH  TWICE DAILY   metFORMIN  (GLUCOPHAGE ) 500 MG tablet Take 2 tablets (1000mg ) in the morning and 1 tablet (500mg ) at night   Omega 3 1000 MG CAPS Take 1 capsule by mouth daily.   OVER THE COUNTER MEDICATION Take 1 tablet by mouth daily. Co-q-10   prasugrel  (EFFIENT ) 10 MG TABS tablet TAKE 1 TABLET BY MOUTH  DAILY   rosuvastatin  (CRESTOR ) 40 MG tablet TAKE 1 TABLET BY MOUTH DAILY   Tirzepatide (MOUNJARO) 5 MG/0.5ML SOPN once a week.     Allergies  Allergen Reactions   Levofloxacin Shortness Of Breath and Swelling    Severe swelling of legs and testicles    Social History   Socioeconomic History   Marital status: Married    Spouse name: Not on file   Number of children: Not on file   Years of education: Not on file   Highest education level: Not on file  Occupational History   Not on file  Tobacco Use   Smoking status: Former    Current packs/day: 0.00    Types: Cigarettes  Quit date: 06/25/2008    Years since quitting: 14.9   Smokeless tobacco: Never   Tobacco comments:    quit in 2010  Substance and Sexual Activity   Alcohol use: Yes    Comment: occasional   Drug use: No   Sexual activity: Yes  Other Topics Concern   Not on file  Social History Narrative   Not on file   Social Drivers of Health   Financial Resource Strain: Not on file  Food Insecurity: Not on file  Transportation Needs: Not on file  Physical Activity: Not on file  Stress: Not on file  Social Connections: Not on file  Intimate  Partner Violence: Not on file     Review of Systems: General: negative for chills, fever, night sweats or weight changes.  Cardiovascular: negative for chest pain, dyspnea on exertion, edema, orthopnea, palpitations, paroxysmal nocturnal dyspnea or shortness of breath Dermatological: negative for rash Respiratory: negative for cough or wheezing Urologic: negative for hematuria Abdominal: negative for nausea, vomiting, diarrhea, bright red blood per rectum, melena, or hematemesis Neurologic: negative for visual changes, syncope, or dizziness All other systems reviewed and are otherwise negative except as noted above.    Blood pressure 126/66, pulse 61, height 6' (1.829 m), weight 238 lb 6.4 oz (108.1 kg), SpO2 95%.  General appearance: alert and no distress Neck: no adenopathy, no carotid bruit, no JVD, supple, symmetrical, trachea midline, and thyroid  not enlarged, symmetric, no tenderness/mass/nodules Lungs: clear to auscultation bilaterally Heart: regular rate and rhythm, S1, S2 normal, no murmur, click, rub or gallop Extremities: extremities normal, atraumatic, no cyanosis or edema Pulses: 2+ and symmetric Skin: Skin color, texture, turgor normal. No rashes or lesions Neurologic: Grossly normal  EKG EKG Interpretation Date/Time:  Monday June 15 2023 08:38:08 EST Ventricular Rate:  61 PR Interval:  224 QRS Duration:  88 QT Interval:  410 QTC Calculation: 412 R Axis:   63  Text Interpretation: Sinus rhythm with 1st degree A-V block When compared with ECG of 19-Jun-2011 10:36, PR interval has increased Confirmed by Court Carrier [phone]) on 06/15/2023 8:41:49 AM    ASSESSMENT AND PLAN:   CAD (coronary artery disease) , stents to LCX & distal RCA in acute setting 07/2009, now nonobstructive ISR will treat medically History of CAD status post stenting of his proximal circumflex and distal RCA by myself in the setting of acute coronary syndrome February 2011.  He was  recatheterized 10/26/2009 in the setting of ACS by Dr. Burnard who documented a patent circumflex stent and high-grade in-stent recently RCA productively performed Cutting Balloon angioplasty with excellent result.  I last studied him 06/19/2011 revealing patent stents with moderate disease in the circumflex.  I performed FFR analysis which suggested this was not physiologically significant.  He is fairly active and asymptomatic.  Essential hypertension History of essential hypertension her blood pressure measured today at 126/66.  He is on amlodipine , lisinopril , hydrochlorothiazide  and Bystolic .  Hyperlipidemia History of hyperlipidemia on high-dose rosuvastatin  and Zetia .  Unfortunately when his last lipid profile was performed he had stopped his rosuvastatin .  This was on 12/30/2022 which time a total cholesterol of 233, LDL of 151 and HDL was 49.  He is now on both medications.  Will recheck a lipid liver profile.  LDL goal less than 70.     Carrier DOROTHA Court MD FACP,FACC,FAHA, [facility] 06/15/2023 8:52 AM

## 2023-06-29 ENCOUNTER — Other Ambulatory Visit: Payer: Self-pay

## 2023-06-29 MED ORDER — ROSUVASTATIN CALCIUM 40 MG PO TABS: 40.0000 mg | ORAL_TABLET | Freq: Every day | ORAL | 3 refills | Status: DC

## 2023-06-29 MED ORDER — EZETIMIBE 10 MG PO TABS: 10.0000 mg | ORAL_TABLET | Freq: Every day | ORAL | 3 refills | Status: DC

## 2024-01-07 DIAGNOSIS — N529 Male erectile dysfunction, unspecified: Secondary | ICD-10-CM | POA: Diagnosis not present

## 2024-01-07 DIAGNOSIS — F5101 Primary insomnia: Secondary | ICD-10-CM | POA: Diagnosis not present

## 2024-01-07 DIAGNOSIS — Z125 Encounter for screening for malignant neoplasm of prostate: Secondary | ICD-10-CM | POA: Diagnosis not present

## 2024-01-07 DIAGNOSIS — E7800 Pure hypercholesterolemia, unspecified: Secondary | ICD-10-CM | POA: Diagnosis not present

## 2024-01-07 DIAGNOSIS — N401 Enlarged prostate with lower urinary tract symptoms: Secondary | ICD-10-CM | POA: Diagnosis not present

## 2024-01-07 DIAGNOSIS — I10 Essential (primary) hypertension: Secondary | ICD-10-CM | POA: Diagnosis not present

## 2024-01-07 DIAGNOSIS — E1140 Type 2 diabetes mellitus with diabetic neuropathy, unspecified: Secondary | ICD-10-CM | POA: Diagnosis not present

## 2024-02-11 DIAGNOSIS — R059 Cough, unspecified: Secondary | ICD-10-CM | POA: Diagnosis not present

## 2024-02-11 DIAGNOSIS — R0981 Nasal congestion: Secondary | ICD-10-CM | POA: Diagnosis not present

## 2024-02-11 DIAGNOSIS — J00 Acute nasopharyngitis [common cold]: Secondary | ICD-10-CM | POA: Diagnosis not present

## 2024-02-17 DIAGNOSIS — J40 Bronchitis, not specified as acute or chronic: Secondary | ICD-10-CM | POA: Diagnosis not present

## 2024-03-14 DIAGNOSIS — M79602 Pain in left arm: Secondary | ICD-10-CM | POA: Diagnosis not present

## 2024-03-25 DIAGNOSIS — R200 Anesthesia of skin: Secondary | ICD-10-CM | POA: Diagnosis not present

## 2024-03-25 DIAGNOSIS — R202 Paresthesia of skin: Secondary | ICD-10-CM | POA: Diagnosis not present

## 2024-03-25 DIAGNOSIS — M25522 Pain in left elbow: Secondary | ICD-10-CM | POA: Diagnosis not present

## 2024-04-11 DIAGNOSIS — G5602 Carpal tunnel syndrome, left upper limb: Secondary | ICD-10-CM | POA: Diagnosis not present

## 2024-06-08 ENCOUNTER — Encounter: Payer: Self-pay | Admitting: Cardiovascular Disease

## 2024-07-05 ENCOUNTER — Other Ambulatory Visit: Payer: Self-pay | Admitting: Cardiovascular Disease
